# Patient Record
Sex: Female | Born: 1965 | Race: Black or African American | Hispanic: No | Smoking: Never smoker
Health system: Southern US, Community
[De-identification: ages and names within clinical notes are randomized; demographics above are authoritative.]

## PROBLEM LIST (undated history)

## (undated) DIAGNOSIS — I493 Ventricular premature depolarization: Secondary | ICD-10-CM

## (undated) DIAGNOSIS — I340 Nonrheumatic mitral (valve) insufficiency: Secondary | ICD-10-CM

## (undated) DIAGNOSIS — Z9289 Personal history of other medical treatment: Secondary | ICD-10-CM

## (undated) DIAGNOSIS — I10 Essential (primary) hypertension: Secondary | ICD-10-CM

## (undated) HISTORY — DX: Personal history of other medical treatment: Z92.89

## (undated) HISTORY — DX: Ventricular premature depolarization: I49.3

## (undated) HISTORY — PX: WISDOM TOOTH EXTRACTION: SHX21

## (undated) HISTORY — DX: Nonrheumatic mitral (valve) insufficiency: I34.0

## (undated) HISTORY — DX: Essential (primary) hypertension: I10

---

## 2001-06-19 ENCOUNTER — Emergency Department (HOSPITAL_COMMUNITY): Admission: EM | Admit: 2001-06-19 | Discharge: 2001-06-19 | Payer: Self-pay

## 2003-09-29 HISTORY — PX: HERNIA REPAIR: SHX51

## 2008-06-19 ENCOUNTER — Ambulatory Visit: Payer: Self-pay | Admitting: Obstetrics and Gynecology

## 2009-11-13 ENCOUNTER — Ambulatory Visit: Payer: Self-pay | Admitting: Obstetrics and Gynecology

## 2010-02-26 ENCOUNTER — Emergency Department (HOSPITAL_COMMUNITY): Admission: EM | Admit: 2010-02-26 | Discharge: 2010-02-26 | Payer: Self-pay | Admitting: Emergency Medicine

## 2010-07-04 ENCOUNTER — Ambulatory Visit: Payer: Self-pay | Admitting: Obstetrics and Gynecology

## 2010-10-16 ENCOUNTER — Other Ambulatory Visit (HOSPITAL_COMMUNITY): Payer: Self-pay | Admitting: Obstetrics and Gynecology

## 2010-10-16 DIAGNOSIS — Z1231 Encounter for screening mammogram for malignant neoplasm of breast: Secondary | ICD-10-CM

## 2010-11-14 ENCOUNTER — Ambulatory Visit (HOSPITAL_COMMUNITY): Payer: Self-pay | Attending: Obstetrics and Gynecology

## 2011-03-26 ENCOUNTER — Inpatient Hospital Stay (INDEPENDENT_AMBULATORY_CARE_PROVIDER_SITE_OTHER)
Admission: RE | Admit: 2011-03-26 | Discharge: 2011-03-26 | Disposition: A | Discharge status: Home / Self Care | Payer: 59 | Source: Ambulatory Visit | Attending: Family Medicine | Admitting: Family Medicine

## 2011-03-26 DIAGNOSIS — W57XXXA Bitten or stung by nonvenomous insect and other nonvenomous arthropods, initial encounter: Secondary | ICD-10-CM

## 2011-03-26 DIAGNOSIS — T148 Other injury of unspecified body region: Secondary | ICD-10-CM

## 2011-05-14 ENCOUNTER — Encounter: Payer: Self-pay | Admitting: Sports Medicine

## 2011-05-14 ENCOUNTER — Ambulatory Visit (INDEPENDENT_AMBULATORY_CARE_PROVIDER_SITE_OTHER): Payer: 59 | Admitting: Sports Medicine

## 2011-05-14 VITALS — BP 125/84 | HR 70 | Ht 64.0 in | Wt 155.0 lb

## 2011-05-14 DIAGNOSIS — M25579 Pain in unspecified ankle and joints of unspecified foot: Secondary | ICD-10-CM

## 2011-05-14 DIAGNOSIS — M2140 Flat foot [pes planus] (acquired), unspecified foot: Secondary | ICD-10-CM

## 2011-05-14 DIAGNOSIS — M25572 Pain in left ankle and joints of left foot: Secondary | ICD-10-CM | POA: Insufficient documentation

## 2011-05-14 DIAGNOSIS — M79673 Pain in unspecified foot: Secondary | ICD-10-CM

## 2011-05-14 DIAGNOSIS — M79609 Pain in unspecified limb: Secondary | ICD-10-CM

## 2011-05-14 DIAGNOSIS — M2142 Flat foot [pes planus] (acquired), left foot: Secondary | ICD-10-CM

## 2011-05-14 DIAGNOSIS — M2141 Flat foot [pes planus] (acquired), right foot: Secondary | ICD-10-CM | POA: Insufficient documentation

## 2011-05-14 DIAGNOSIS — Q6650 Congenital pes planus, unspecified foot: Secondary | ICD-10-CM

## 2011-05-14 DIAGNOSIS — Q666 Other congenital valgus deformities of feet: Secondary | ICD-10-CM | POA: Insufficient documentation

## 2011-05-14 NOTE — Progress Notes (Signed)
  Subjective:    Patient ID: Jodi Hanson, female    DOB: 11-12-1965, 45 y.o.   MRN: [id number]  HPI Jodi Hanson is a new patient complaining of B/L arch pain after running. She ran 3 years ago, stop running completely and restarted 1 month ago.  R arch worse than L during running, 7/10, improve by rest and tapping, not radiated, no injuries, she had this pain 3 years ago. She is in running school but she is also running on her own. Also L ankle pain with 2/10, dull ache, no numbness, no tingling , no injuries. She is using a ankle brace which helps. Her pain improves after running and resting. She has orthotics for the last year.   PMH: HTN  Meds: Enalapril.   No Known Allergies  History   Social History  . Marital Status: Married    Spouse Name: N/A    Number of Children: N/A  . Years of Education: N/A   Social History Main Topics  . Smoking status: Never Smoker   . Smokeless tobacco: Never Used  . Alcohol Use: None  . Drug Use: None  . Sexually Active: None   Other Topics Concern  . None   Social History Narrative  . None      Review of Systems  Constitutional: Negative for fever, chills, diaphoresis and fatigue.  Musculoskeletal: Negative for back pain, joint swelling and gait problem.       Feet pain with HPI  Neurological: Negative for weakness and numbness.          Objective:   Physical Exam  Constitutional: She appears well-developed and well-nourished.       BP 125/84  Pulse 70  Ht 5\' 4"  (1.626 m)  Wt 155 lb (70.308 kg)  BMI 26.61 kg/m2   Pulmonary/Chest: Effort normal.  Neurological: She is alert.       Flat feet with fore and mid feet collapse. There is not TTP in the archs or heels b/l. Sensation intact distally.  L ankle with intact skin, FROM , mild TTP at the ATFL.  Drawer test 1+. Tilt test negative.  Gait with B/l collapse arch.  Hypermobility for back flexion,genu recurvatum, left elbow mild recurvatum 5 degrees.  Skin: Skin  is warm. No rash noted. No erythema. No pallor.  Psychiatric: She has a normal mood and affect. Judgment normal.          Assessment & Plan:   1. Foot pain   2. Pes planus (flat feet)   3. Pes valgus   4. Ankle pain, left   Sports insoles Arch straps Mid scaphoid pads F/u in 4 weeks. We will make custom orthotics if she notice a benefits with the corrections made today.

## 2011-06-18 ENCOUNTER — Ambulatory Visit (INDEPENDENT_AMBULATORY_CARE_PROVIDER_SITE_OTHER): Payer: 59 | Admitting: Sports Medicine

## 2011-06-18 VITALS — BP 108/70

## 2011-06-18 DIAGNOSIS — M2141 Flat foot [pes planus] (acquired), right foot: Secondary | ICD-10-CM

## 2011-06-18 DIAGNOSIS — M25579 Pain in unspecified ankle and joints of unspecified foot: Secondary | ICD-10-CM

## 2011-06-18 DIAGNOSIS — M79673 Pain in unspecified foot: Secondary | ICD-10-CM

## 2011-06-18 DIAGNOSIS — M2140 Flat foot [pes planus] (acquired), unspecified foot: Secondary | ICD-10-CM

## 2011-06-18 DIAGNOSIS — M79609 Pain in unspecified limb: Secondary | ICD-10-CM

## 2011-06-18 DIAGNOSIS — M25572 Pain in left ankle and joints of left foot: Secondary | ICD-10-CM

## 2011-06-18 NOTE — Progress Notes (Signed)
  Subjective:    Patient ID: Jodi Hanson, female    DOB: 09-Aug-1966, 45 y.o.   MRN: [id number]  HPI  Coming to f/u on feet arch pain and left ankle pain. Her arch pain is gone after using the sport insoles with scaphoid pads and the arch strap. She is running 10-12 mi a week with no feet pain. She has been using her orthotics on her shoes to run. Her left ankle hurts after she has run for 45 min, it does not hurts while she runs but after, she denies any ankle pain during regular daily life activities. Her pain is 3/10, ache, no radiated, worse with running, better with rest.  She states that she rolled her ankle in the past , never did formal PT. Patient Active Problem List  Diagnoses  . Foot pain  . Pes planus (flat feet)  . Pes valgus  . Ankle pain, left   Current Outpatient Prescriptions on File Prior to Visit  Medication Sig Dispense Refill  . calcium carbonate 200 MG capsule Take 250 mg by mouth daily.        . enalapril (VASOTEC) 5 MG tablet Take 5 mg by mouth daily.        Marland Kitchen estradiol (ESTRACE) 2 MG tablet Take 2 mg by mouth daily.        . Multiple Vitamins-Minerals (MULTIVITAMIN WITH MINERALS) tablet Take 1 tablet by mouth daily.        . progesterone (PROMETRIUM) 100 MG capsule Take 100 mg by mouth daily.         No Known Allergies     Review of Systems  Constitutional: Negative for fever, chills and fatigue.  HENT: Negative for neck pain.   Musculoskeletal: Negative for joint swelling and gait problem.       Feet arch pain resolved. Left ankle pain with HPI  Neurological: Negative for seizures, weakness and numbness.       Objective:   Physical Exam  Constitutional: She is oriented to person, place, and time. She appears well-developed and well-nourished.       BP 108/70   Pulmonary/Chest: Effort normal. Tenderness: .vs.  Musculoskeletal:       Flat Feet. No TTP in the arch B/L. No heel pain.  L ankle with intact skin, no swelling , no inflammation. TTP  in ATFL.    Anterior drawer test negative. Negative tilt test.  Gait independent w/o limp.  Sensation intact distally.     Neurological: She is alert and oriented to person, place, and time.  Skin: Skin is warm. No rash noted. No erythema. No pallor.  Psychiatric: She has a normal mood and affect.          Assessment & Plan:   1. Foot pain   2. Pes planus (flat feet)   3. Ankle pain, left    She will schedule an appointment to have custom orthotics done. We started her on and ankle ASO brace for her l;eft ankle pain. We will let her try running with the brace to see if that improves her pain. If her ankle pain persist , we recommend doing image studies MSK U/S or x ray to r/o OCD lesion or any other structural abnormalities.

## 2011-06-25 ENCOUNTER — Encounter: Payer: 59 | Admitting: Family Medicine

## 2012-04-12 ENCOUNTER — Encounter: Payer: 59 | Attending: Internal Medicine | Admitting: *Deleted

## 2012-04-12 ENCOUNTER — Encounter: Payer: Self-pay | Admitting: *Deleted

## 2012-04-12 DIAGNOSIS — E669 Obesity, unspecified: Secondary | ICD-10-CM | POA: Insufficient documentation

## 2012-04-12 DIAGNOSIS — Z713 Dietary counseling and surveillance: Secondary | ICD-10-CM | POA: Insufficient documentation

## 2012-04-12 NOTE — Patient Instructions (Signed)
Contact [employer] about joining- continue to swim 2 days/week.  Add jogging 2 days/week, continue to walk daily  Eventually add weight training 2 days/week Prepare foods fresh as much as possible.  Limit processed foods and eating out Look for Fiber One chocolate cereal or brownies/chocolate cookie bars- great afternoon sweet snack Choose high protein- high fiber breakfast cereal like Kashi Aim for lean protein at every meal Aim for 2 fruits and 2 vegetables  day

## 2012-04-12 NOTE — Progress Notes (Signed)
  Medical Nutrition Therapy:  Appt start time: 0830 end time:  0930.   Assessment:  Primary concerns today: overweight and hypertension.  Feel tired, moody, sluggish and believe that her diet/lifestyle contributes to energy level.   MEDICATIONS: see list   DIETARY INTAKE:  Usual eating pattern includes 3 meals and 2-3 snacks per day.  Everyday foods include lean meats, fruits, vegetables, some starches.  Avoided foods include fast food.    24-hr recall:  B ( AM): cheerios, shredded wheat, special K with soy milk (may have 2 servings)  Snk ( AM): granola bar or cereal bar or fruit  L ( PM): deli wheat sandwich ([place]) subway sandwich with vegetables and oil/vinegar; may have fried chicken wings Snk ( PM): frozen yogurt, cookies, or brownies D ( PM): grilled chicken, grilled fish, green vegetables, light dressing on salad. No starch with dinner Snk ( PM): popsicle or margarita or wine Beverages: water  Usual physical activity: swim 2 days a week (45 min) and walk 20 min daily  Estimated energy needs: 1500 calories 170 g carbohydrates 112 g protein 42 g fat  Progress Towards Goal(s):  Some progress.   Nutritional Diagnosis:  [place]-3.3 Overweight/obesity As related to limited physical activity and history of contuming energy-dense foods.  As evidenced by BMI of 27.    Intervention:  Nutrition counseling provided.  Jodi Hanson was 167.5lb in December of 2012 and has lost 8 pounds since then!!  She has cut back on fast food consumption and has increased her physical activity some.  She would like to loose about 20 lb more.  She also has hypertension and tries to limit her sodium consumption.  Encouraged her to limit, not only added salt, but also instant and processed and ready-to-eat meals.  Discussed adding more fiber and lean proteins to her meals though fruits, vegetables, whole grains, nuts and seeds.  She was curious about a gluten-free diet,  Discouraged her from cutting out food groups  without a diagnosis of Celiac's.  Encouraged increasing physical activity and adding weight training to her routine  Handouts given during visit include:  Tips for weight loss  Monitoring/Evaluation:  Dietary intake, exercise, and body weight prn.  Jodi Hanson will call to make appointment

## 2014-06-28 ENCOUNTER — Ambulatory Visit (INDEPENDENT_AMBULATORY_CARE_PROVIDER_SITE_OTHER): Payer: BC Managed Care – PPO | Admitting: Family Medicine

## 2014-06-28 VITALS — BP 112/70 | HR 83 | Temp 98.5°F | Resp 16 | Ht 64.75 in | Wt 150.2 lb

## 2014-06-28 DIAGNOSIS — J069 Acute upper respiratory infection, unspecified: Secondary | ICD-10-CM

## 2014-06-28 DIAGNOSIS — I10 Essential (primary) hypertension: Secondary | ICD-10-CM

## 2014-06-28 LAB — POCT RAPID STREP A (OFFICE): Rapid Strep A Screen: NEGATIVE

## 2014-06-28 LAB — POCT INFLUENZA A/B
Influenza A, POC: NEGATIVE
Influenza B, POC: NEGATIVE

## 2014-06-28 MED ORDER — MUCINEX DM 30-600 MG PO TB12: 1.0000 | ORAL_TABLET | Freq: Two times a day (BID) | ORAL | Status: DC

## 2014-06-28 MED ORDER — BENZONATATE 100 MG PO CAPS: 100.0000 mg | ORAL_CAPSULE | Freq: Three times a day (TID) | ORAL | Status: DC | PRN

## 2014-06-28 MED ORDER — AMLODIPINE BESYLATE 5 MG PO TABS: 5.0000 mg | ORAL_TABLET | Freq: Every day | ORAL | Status: DC

## 2014-06-28 NOTE — Patient Instructions (Signed)
Upper Respiratory Infection, Adult An upper respiratory infection (URI) is also sometimes known as the common cold. The upper respiratory tract includes the nose, sinuses, throat, trachea, and bronchi. Bronchi are the airways leading to the lungs. Most people improve within 1 week, but symptoms can last up to 2 weeks. A residual cough may last even longer.  CAUSES Many different viruses can infect the tissues lining the upper respiratory tract. The tissues become irritated and inflamed and often become very moist. Mucus production is also common. A cold is contagious. You can easily spread the virus to others by oral contact. This includes kissing, sharing a glass, coughing, or sneezing. Touching your mouth or nose and then touching a surface, which is then touched by another person, can also spread the virus. SYMPTOMS  Symptoms typically develop 1 to 3 days after you come in contact with a cold virus. Symptoms vary from person to person. They may include:  Runny nose.  Sneezing.  Nasal congestion.  Sinus irritation.  Sore throat.  Loss of voice (laryngitis).  Cough.  Fatigue.  Muscle aches.  Loss of appetite.  Headache.  Low-grade fever. DIAGNOSIS  You might diagnose your own cold based on familiar symptoms, since most people get a cold 2 to 3 times a year. Your caregiver can confirm this based on your exam. Most importantly, your caregiver can check that your symptoms are not due to another disease such as strep throat, sinusitis, pneumonia, asthma, or epiglottitis. Blood tests, throat tests, and X-rays are not necessary to diagnose a common cold, but they may sometimes be helpful in excluding other more serious diseases. Your caregiver will decide if any further tests are required. RISKS AND COMPLICATIONS  You may be at risk for a more severe case of the common cold if you smoke cigarettes, have chronic heart disease (such as heart failure) or lung disease (such as asthma), or if  you have a weakened immune system. The very young and very old are also at risk for more serious infections. Bacterial sinusitis, middle ear infections, and bacterial pneumonia can complicate the common cold. The common cold can worsen asthma and chronic obstructive pulmonary disease (COPD). Sometimes, these complications can require emergency medical care and may be life-threatening. PREVENTION  The best way to protect against getting a cold is to practice good hygiene. Avoid oral or hand contact with people with cold symptoms. Wash your hands often if contact occurs. There is no clear evidence that vitamin C, vitamin E, echinacea, or exercise reduces the chance of developing a cold. However, it is always recommended to get plenty of rest and practice good nutrition. TREATMENT  Treatment is directed at relieving symptoms. There is no cure. Antibiotics are not effective, because the infection is caused by a virus, not by bacteria. Treatment may include:  Increased fluid intake. Sports drinks offer valuable electrolytes, sugars, and fluids.  Breathing heated mist or steam (vaporizer or shower).  Eating chicken soup or other clear broths, and maintaining good nutrition.  Getting plenty of rest.  Using gargles or lozenges for comfort.  Controlling fevers with ibuprofen or acetaminophen as directed by your caregiver.  Increasing usage of your inhaler if you have asthma. Zinc gel and zinc lozenges, taken in the first 24 hours of the common cold, can shorten the duration and lessen the severity of symptoms. Pain medicines may help with fever, muscle aches, and throat pain. A variety of non-prescription medicines are available to treat congestion and runny nose. Your caregiver   can make recommendations and may suggest nasal or lung inhalers for other symptoms.  HOME CARE INSTRUCTIONS   Only take over-the-counter or prescription medicines for pain, discomfort, or fever as directed by your  caregiver.  Use a warm mist humidifier or inhale steam from a shower to increase air moisture. This may keep secretions moist and make it easier to breathe.  Drink enough water and fluids to keep your urine clear or pale yellow.  Rest as needed.  Return to work when your temperature has returned to normal or as your caregiver advises. You may need to stay home longer to avoid infecting others. You can also use a face mask and careful hand washing to prevent spread of the virus. SEEK MEDICAL CARE IF:   After the first few days, you feel you are getting worse rather than better.  You need your caregiver's advice about medicines to control symptoms.  You develop chills, worsening shortness of breath, or brown or red sputum. These may be signs of pneumonia.  You develop yellow or brown nasal discharge or pain in the face, especially when you bend forward. These may be signs of sinusitis.  You develop a fever, swollen neck glands, pain with swallowing, or white areas in the back of your throat. These may be signs of strep throat. SEEK IMMEDIATE MEDICAL CARE IF:   You have a fever.  You develop severe or persistent headache, ear pain, sinus pain, or chest pain.  You develop wheezing, a prolonged cough, cough up blood, or have a change in your usual mucus (if you have chronic lung disease).  You develop sore muscles or a stiff neck. Document Released: 03/10/2001 Document Revised: 12/07/2011 Document Reviewed: 12/20/2013 ExitCare Patient Information 2015 [employer]. This information is not intended to replace advice given to you by your health care provider. Make sure you discuss any questions you have with your health care provider.  

## 2014-06-28 NOTE — Progress Notes (Addendum)
Subjective:    Patient ID: Jodi Hanson, female    DOB: June 22, 1966, 48 y.o.   MRN: [id number] This chart was scribed for Reginia Forts, MD by Randa Evens, ED Scribe. This Patient was seen in room 10 and the patients care was started at 8:03 PM  06/28/2014  Sore Throat, Headache, Generalized Body Aches and Cough   Sore Throat  Associated symptoms include congestion, coughing and headaches. Pertinent negatives include no abdominal pain, diarrhea, ear pain, shortness of breath or vomiting.  Headache  Associated symptoms include coughing and a sore throat. Pertinent negatives include no abdominal pain, ear pain, fever, nausea, rhinorrhea, sinus pressure or vomiting.  Cough Associated symptoms include chills, headaches and a sore throat. Pertinent negatives include no ear pain, fever, rash, rhinorrhea or shortness of breath.   HPI Comments: Jodi Hanson is a 48 y.o. female who presents to the [facility] complaining of sore throat onset 2 days prior. She states she has associated headache, productive cough, and  head congestion. She states she has tried benadryl with temporary relief. She states she has also been drinking a lot of hot tea and vitamin C with temporary relief. She denies receiving a flu shot this year. Denies ear pain, rhinorrhea, SOB when walking, vomiting, or diarrhea.   Patient also requesting refill of blood pressure medication. Patient reports good compliance with medication, good tolerance to medication, and good symptom control.     Review of Systems  Constitutional: Positive for chills, diaphoresis and fatigue. Negative for fever.  HENT: Positive for congestion and sore throat. Negative for ear pain, rhinorrhea and sinus pressure.   Respiratory: Positive for cough. Negative for shortness of breath.   Gastrointestinal: Negative for nausea, vomiting, abdominal pain and diarrhea.  Skin: Negative for rash.  Neurological: Positive for headaches.     Past Medical History  Diagnosis Date   Hypertension    Past Surgical History  Procedure Laterality Date   Cesarean section     No Known Allergies Current Outpatient Prescriptions  Medication Sig Dispense Refill   amLODipine (NORVASC) 5 MG tablet Take 1 tablet (5 mg total) by mouth daily.  30 tablet  2   benzonatate (TESSALON) 100 MG capsule Take 1-2 capsules (100-200 mg total) by mouth 3 (three) times daily as needed for cough.  60 capsule  0   calcium carbonate 200 MG capsule Take 250 mg by mouth daily.         Dextromethorphan-Guaifenesin (MUCINEX DM) 30-600 MG TB12 Take 1 tablet by mouth 2 (two) times daily.  28 each  0   enalapril (VASOTEC) 5 MG tablet Take 5 mg by mouth daily.         estradiol (ESTRACE) 2 MG tablet Take 2 mg by mouth daily.         Multiple Vitamins-Minerals (MULTIVITAMIN WITH MINERALS) tablet Take 1 tablet by mouth daily.         progesterone (PROMETRIUM) 100 MG capsule Take 100 mg by mouth daily.         No current facility-administered medications for this visit.       Objective:    BP 112/70   Pulse 83   Temp(Src) 98.5 F (36.9 C) (Oral)   Resp 16   Ht 5' 4.75" (1.645 m)   Wt 150 lb 3.2 oz (68.13 kg)   BMI 25.18 kg/m2   SpO2 100%  Physical Exam  Nursing note and vitals reviewed. Constitutional: She is oriented to person,  place, and time. She appears well-developed and well-nourished. No distress.  HENT:  Head: Normocephalic and atraumatic.  Right Ear: External ear normal.  Left Ear: External ear normal.  Nose: Nose normal.  Mouth/Throat: Posterior oropharyngeal erythema present. No oropharyngeal exudate.  Eyes: Conjunctivae and EOM are normal. Pupils are equal, round, and reactive to light.  Neck: Normal range of motion. Neck supple. No tracheal deviation present.  Cardiovascular: Normal rate, regular rhythm and normal heart sounds.  Exam reveals no gallop and no friction rub.   No murmur heard. Pulmonary/Chest: Effort normal and  breath sounds normal. No respiratory distress. She has no wheezes. She has no rales.  Musculoskeletal: Normal range of motion.  Lymphadenopathy:    She has no cervical adenopathy.  Neurological: She is alert and oriented to person, place, and time.  Skin: Skin is warm and dry. No rash noted. She is not diaphoretic.  Psychiatric: She has a normal mood and affect. Her behavior is normal.    Results for orders placed in visit on 06/28/14  CULTURE, GROUP A STREP      Result Value Ref Range   Organism ID, Bacteria Normal Upper Respiratory Flora     Organism ID, Bacteria No Beta Hemolytic Streptococci Isolated    POCT RAPID STREP A (OFFICE)      Result Value Ref Range   Rapid Strep A Screen Negative  Negative  POCT INFLUENZA A/B      Result Value Ref Range   Influenza A, POC Negative     Influenza B, POC Negative         Assessment & Plan:   1. Acute upper respiratory infection   2. Essential hypertension     1. URI:  New.  Consistent with viral etiology.    Rx for [employer], Mucinex DM provided. Recommend rest, fluids, Ibuprofen and/or Tylenol.  RTC for acute worsening.  2.  HTN: controlled; refill of Amlodipine provided.  Recommend follow-up with PCP.    Meds ordered this encounter  Medications   DISCONTD: amLODipine (NORVASC) 5 MG tablet    Sig: Take 5 mg by mouth daily.   benzonatate (TESSALON) 100 MG capsule    Sig: Take 1-2 capsules (100-200 mg total) by mouth 3 (three) times daily as needed for cough.    Dispense:  60 capsule    Refill:  0   Dextromethorphan-Guaifenesin (MUCINEX DM) 30-600 MG TB12    Sig: Take 1 tablet by mouth 2 (two) times daily.    Dispense:  28 each    Refill:  0   amLODipine (NORVASC) 5 MG tablet    Sig: Take 1 tablet (5 mg total) by mouth daily.    Dispense:  30 tablet    Refill:  2    No Follow-up on file.   I personally performed the services described in this documentation, which was scribed in my presence.  The recorded  information has been reviewed and is accurate.  Reginia Forts, M.D.  [facility] [address] [place], [place]  [address] [phone] phone [phone] fax

## 2014-07-01 LAB — CULTURE, GROUP A STREP: Organism ID, Bacteria: NORMAL

## 2014-09-18 ENCOUNTER — Telehealth: Payer: Self-pay

## 2014-09-18 NOTE — Telephone Encounter (Signed)
Advised pt to call PCP. She states she will. She no longer has insurance coverage. Advised her to call us back if not able to get the change please contact us back.

## 2014-09-18 NOTE — Telephone Encounter (Signed)
Pt would like to know if we are able to change her bp meds due to insurance, her insurance won't cover what she was given but if we changed to something else and can get at [facility] FOR 10.00 dollars She think it may be ENALAPRIL 5MG S, please call pt at [place]

## 2014-09-19 ENCOUNTER — Other Ambulatory Visit: Payer: Self-pay

## 2014-09-19 DIAGNOSIS — I10 Essential (primary) hypertension: Secondary | ICD-10-CM

## 2014-09-19 MED ORDER — AMLODIPINE BESYLATE 5 MG PO TABS
5.0000 mg | ORAL_TABLET | Freq: Every day | ORAL | Status: DC
Start: 2014-09-19 — End: 2015-01-18

## 2014-12-12 ENCOUNTER — Encounter: Payer: Self-pay | Admitting: Family

## 2014-12-12 ENCOUNTER — Ambulatory Visit (INDEPENDENT_AMBULATORY_CARE_PROVIDER_SITE_OTHER): Payer: 59 | Admitting: Family

## 2014-12-12 VITALS — BP 102/78 | HR 77 | Temp 98.0°F | Resp 16 | Ht 63.5 in | Wt 147.4 lb

## 2014-12-12 DIAGNOSIS — Z0000 Encounter for general adult medical examination without abnormal findings: Secondary | ICD-10-CM

## 2014-12-12 DIAGNOSIS — I10 Essential (primary) hypertension: Secondary | ICD-10-CM

## 2014-12-12 NOTE — Patient Instructions (Signed)
Decrease amlodipine to 2.5 mg once daily (1/2 tab). Follow up in 1 month for a complete physical.  Welcome to [facility]!

## 2014-12-12 NOTE — Progress Notes (Signed)
Subjective:    Patient ID: Jodi Hanson, female    DOB: 04/18/1966, 49 y.o.   MRN: [id number]  HPI  Jodi Hanson is a 49 yr old female who presents today to establish care. Took meds starting at 48.    Patient is currently maintained on the following medications for blood pressure: amlodipine 5mg  once daily.  Patient reports good compliance with blood pressure medications. Patient denies chest pain, shortness of breath or swelling. Last 3 blood pressure readings in our office are as follows: BP Readings from Last 3 Encounters:  12/12/14 102/78  06/28/14 112/70  06/18/11 108/70   She reports that she has had hx of hyperlipidemia- mild.    Review of Systems  Constitutional: Negative for unexpected weight change.  HENT: Negative for hearing loss and rhinorrhea.   Eyes: Negative for visual disturbance.  Respiratory: Negative for cough and shortness of breath.   Cardiovascular: Negative for chest pain and leg swelling.  Gastrointestinal: Negative for nausea, diarrhea and constipation.  Genitourinary: Negative for dysuria, frequency and hematuria.  Musculoskeletal: Negative for myalgias and arthralgias.  Skin: Negative for rash.  Neurological: Negative for headaches.  Hematological: Negative for adenopathy.  Psychiatric/Behavioral: Negative for dysphoric mood and agitation.       Past Medical History  Diagnosis Date  . Hypertension     History   Social History  . Marital Status: Divorced    Spouse Name: N/A  . Number of Children: N/A  . Years of Education: N/A   Occupational History  . Not on file.   Social History Main Topics  . Smoking status: Never Smoker   . Smokeless tobacco: Never Used  . Alcohol Use: 1.8 oz/week    3 Glasses of wine per week  . Drug Use: Not on file  . Sexual Activity: Not on file   Other Topics Concern  . 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 Cesarean section      Family History  Problem Relation Age of Onset  . Hypertension Other   . Diabetes Mother   . Hyperlipidemia Mother   . Hypertension Mother   . Heart disease Father   . Hypertension Brother   . Hypertension Brother   . HIV Brother     No Known Allergies  Current Outpatient Prescriptions on File Prior to Visit  Medication Sig Dispense Refill  . amLODipine (NORVASC) 5 MG tablet Take 1 tablet (5 mg total) by mouth daily. 90 tablet 0  . Multiple Vitamins-Minerals (MULTIVITAMIN WITH MINERALS) tablet Take 1 tablet by mouth daily.       No current facility-administered medications on file prior to visit.    BP 102/78 mmHg  Pulse 77  Temp(Src) 98 F (36.7 C) (Oral)  Resp 16  Ht 5' 3.5" (1.613 m)  Wt 147 lb 6.4 oz (66.86 kg)  BMI 25.70 kg/m2  SpO2 99%    Objective:   Physical Exam  Constitutional: She is oriented to person, place, and time. She appears well-developed and well-nourished.  HENT:  Head: Normocephalic and atraumatic.  Right Ear: Tympanic membrane and ear canal normal.  Left Ear: Tympanic membrane and ear canal normal.  Mouth/Throat: No oropharyngeal exudate, posterior oropharyngeal edema or posterior oropharyngeal erythema.  Cardiovascular:  Normal rate, regular rhythm and normal heart sounds.   No murmur heard. Pulmonary/Chest: Effort normal and breath sounds normal. No respiratory distress. She has no wheezes.  Abdominal: Soft. She exhibits no distension. There is no tenderness. There is no rebound.  Neurological: She is alert and oriented to person, place, and time.  Psychiatric: She has a normal mood and affect. Her behavior is normal. Judgment and thought content normal.          Assessment & Plan:  She is requesting referral to optometry for routine eye exam.

## 2014-12-12 NOTE — Assessment & Plan Note (Signed)
Appears overtreated.  Decrease amlodipine from 5mg  to 2.5mg . Consider d/c next visit pending BP review.  Plan to check routine lab work at her upcoming physical in 1 month.

## 2014-12-28 ENCOUNTER — Telehealth: Payer: Self-pay | Admitting: Family

## 2014-12-28 NOTE — Telephone Encounter (Signed)
Pre visit letter sent  °

## 2015-01-16 ENCOUNTER — Telehealth: Payer: Self-pay | Admitting: *Deleted

## 2015-01-16 NOTE — Telephone Encounter (Signed)
Unable to reach patient at time of Pre-Visit Call.  Left message for patient to return call when available.    

## 2015-01-16 NOTE — Telephone Encounter (Signed)
Pre visit completed 

## 2015-01-18 ENCOUNTER — Ambulatory Visit (INDEPENDENT_AMBULATORY_CARE_PROVIDER_SITE_OTHER): Payer: 59 | Admitting: Family

## 2015-01-18 ENCOUNTER — Ambulatory Visit (HOSPITAL_BASED_OUTPATIENT_CLINIC_OR_DEPARTMENT_OTHER)
Admission: RE | Admit: 2015-01-18 | Discharge: 2015-01-18 | Disposition: A | Discharge status: Home / Self Care | Payer: 59 | Source: Ambulatory Visit | Attending: Family | Admitting: Family

## 2015-01-18 VITALS — BP 118/78 | HR 73 | Temp 98.0°F | Resp 16 | Ht 63.5 in | Wt 140.0 lb

## 2015-01-18 DIAGNOSIS — Z0000 Encounter for general adult medical examination without abnormal findings: Secondary | ICD-10-CM | POA: Diagnosis not present

## 2015-01-18 DIAGNOSIS — Z1231 Encounter for screening mammogram for malignant neoplasm of breast: Secondary | ICD-10-CM | POA: Diagnosis not present

## 2015-01-18 DIAGNOSIS — I10 Essential (primary) hypertension: Secondary | ICD-10-CM

## 2015-01-18 LAB — CBC WITH DIFFERENTIAL/PLATELET
Basophils Absolute: 0 10*3/uL (ref 0.0–0.1)
Basophils Relative: 0.9 % (ref 0.0–3.0)
Eosinophils Absolute: 0.1 10*3/uL (ref 0.0–0.7)
Eosinophils Relative: 2.7 % (ref 0.0–5.0)
HCT: 38.5 % (ref 36.0–46.0)
Hemoglobin: 12.9 g/dL (ref 12.0–15.0)
Lymphocytes Relative: 46.7 % — ABNORMAL HIGH (ref 12.0–46.0)
Lymphs Abs: 1.8 10*3/uL (ref 0.7–4.0)
MCHC: 33.6 g/dL (ref 30.0–36.0)
MCV: 78.7 fl (ref 78.0–100.0)
Monocytes Absolute: 0.4 10*3/uL (ref 0.1–1.0)
Monocytes Relative: 8.9 % (ref 3.0–12.0)
Neutro Abs: 1.6 10*3/uL (ref 1.4–7.7)
Neutrophils Relative %: 40.8 % — ABNORMAL LOW (ref 43.0–77.0)
Platelets: 323 10*3/uL (ref 150.0–400.0)
RBC: 4.9 Mil/uL (ref 3.87–5.11)
RDW: 14.1 % (ref 11.5–15.5)
WBC: 4 10*3/uL (ref 4.0–10.5)

## 2015-01-18 LAB — URINALYSIS, ROUTINE W REFLEX MICROSCOPIC
Bilirubin Urine: NEGATIVE
Hgb urine dipstick: NEGATIVE
Ketones, ur: NEGATIVE
Leukocytes, UA: NEGATIVE
Nitrite: NEGATIVE
Specific Gravity, Urine: >=1.03 — AB (ref 1.000–1.030)
Total Protein, Urine: NEGATIVE
Urine Glucose: NEGATIVE
Urobilinogen, UA: 0.2 (ref 0.0–1.0)
pH: 5.5 (ref 5.0–8.0)

## 2015-01-18 LAB — BASIC METABOLIC PANEL
BUN: 10 mg/dL (ref 6–23)
CO2: 30 mEq/L (ref 19–32)
Calcium: 9.2 mg/dL (ref 8.4–10.5)
Chloride: 105 mEq/L (ref 96–112)
Creatinine, Ser: 0.84 mg/dL (ref 0.40–1.20)
GFR: 93.72 mL/min (ref >60.00)
Glucose, Bld: 86 mg/dL (ref 70–99)
Potassium: 4 mEq/L (ref 3.5–5.1)
Sodium: 140 mEq/L (ref 135–145)

## 2015-01-18 LAB — TSH: TSH: 1.24 u[IU]/mL (ref 0.35–4.50)

## 2015-01-18 LAB — HEPATIC FUNCTION PANEL
ALT: 24 U/L (ref 0–35)
AST: 27 U/L (ref 0–37)
Albumin: 4.2 g/dL (ref 3.5–5.2)
Alkaline Phosphatase: 88 U/L (ref 39–117)
Bilirubin, Direct: 0.2 mg/dL (ref 0.0–0.3)
Total Bilirubin: 0.8 mg/dL (ref 0.2–1.2)
Total Protein: 7.4 g/dL (ref 6.0–8.3)

## 2015-01-18 LAB — LIPID PANEL
Cholesterol: 193 mg/dL (ref 0–200)
HDL: 58.3 mg/dL (ref >39.00)
LDL Cholesterol: 126 mg/dL — ABNORMAL HIGH (ref 0–99)
NonHDL: 134.7
Total CHOL/HDL Ratio: 3
Triglycerides: 46 mg/dL (ref 0.0–149.0)
VLDL: 9.2 mg/dL (ref 0.0–40.0)

## 2015-01-18 LAB — HIV ANTIBODY (ROUTINE TESTING W REFLEX): HIV 1&2 Ab, 4th Generation: NONREACTIVE

## 2015-01-18 NOTE — Progress Notes (Signed)
Pre visit review using our clinic review tool, if applicable. No additional management support is needed unless otherwise documented below in the visit note. 

## 2015-01-18 NOTE — Patient Instructions (Signed)
Please complete lab work prior to leaving. Schedule mammogram on the first floor. Stop amlodipine. Check blood pressure once daily for 1 week. Send me your blood pressure readings via mychart. Follow up in 3 months.

## 2015-01-18 NOTE — Progress Notes (Signed)
Subjective:    Patient ID: Jodi Hanson, female    DOB: 10/09/1965, 49 y.o.   MRN: [id number]  HPI   Patient presents today for complete physical.  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 Patient denies chest pain, shortness of breath or swelling. Last 3 blood pressure readings in our office are as follows:  BP Readings from Last 3 Encounters:  01/18/15 118/78  12/12/14 102/78  06/28/14 112/70         Review of Systems  Constitutional: Negative for unexpected weight change.  HENT: Negative for rhinorrhea.   Respiratory: Negative for cough and shortness of breath.   Cardiovascular: Negative for chest pain.  Gastrointestinal: Negative for nausea, vomiting and diarrhea.  Genitourinary: Negative for dysuria and frequency.  Musculoskeletal: Negative for myalgias and arthralgias.  Skin: Negative for rash.  Neurological: Negative for headaches.  Hematological: Negative for adenopathy.  Psychiatric/Behavioral:       Depression/anxiety- denies   Past Medical History  Diagnosis Date  . Hypertension     History   Social History  . Marital Status: Divorced    Spouse Name: N/A  . Number of Children: N/A  . Years of Education: N/A   Occupational History  . Not on file.   Social History Main Topics  . Smoking status: Never Smoker   . Smokeless tobacco: Never Used  . Alcohol Use: 1.8 oz/week    3 Glasses of wine per week  . Drug Use: Not on file  . Sexual Activity: Not on file   Other Topics Concern  . 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 Cesarean section      Family History  Problem Relation Age of Onset  . Hypertension Other   . Diabetes Mother   . Hyperlipidemia Mother   . Hypertension Mother   . Heart disease Father   . Hypertension Brother   . Hypertension Brother   . HIV Brother     No Known Allergies  Current Outpatient Prescriptions on File Prior to Visit  Medication Sig Dispense Refill  . amLODipine (NORVASC) 5 MG tablet Take 1 tablet (5 mg total) by mouth daily. (Patient taking differently: Take 2.5 mg by mouth daily. ) 90 tablet 0  . Multiple Vitamins-Minerals (MULTIVITAMIN WITH MINERALS) tablet Take 1 tablet by mouth daily.       No current facility-administered medications on file prior to visit.    BP 118/78 mmHg  Pulse 73  Temp(Src) 98 F (36.7 C) (Oral)  Resp 16  Ht 5' 3.5" (1.613 m)  Wt 140 lb (63.504 kg)  BMI 24.41 kg/m2  SpO2 99%        Objective:   Physical Exam  Physical Exam  Constitutional: She is oriented to person, place, and time. She appears well-developed and well-nourished. No distress.  HENT:  Head: Normocephalic and atraumatic.  Right Ear: Tympanic membrane and ear canal normal.  Left Ear:  Tympanic membrane and ear canal normal.  Mouth/Throat: Oropharynx is clear and moist.  Eyes: Pupils are equal, round, and reactive to light. No scleral icterus.  Neck: Normal range of motion. No thyromegaly present.  Cardiovascular: Normal rate and regular rhythm.   No murmur heard. Pulmonary/Chest: Effort normal and breath sounds normal. No respiratory distress. He has no wheezes. She has no rales. She exhibits no tenderness.  Abdominal: Soft. Bowel sounds are normal. He exhibits no distension and no mass. There is no tenderness. There is no rebound and no guarding.  Musculoskeletal: She exhibits no edema.  Lymphadenopathy:    She has no cervical adenopathy.    Neurological: She is alert and oriented to person, place, and time. She has normal reflexes. She exhibits normal muscle tone. Coordination normal.  Skin: Skin is warm and dry.  Psychiatric: She has a normal mood and affect. Her behavior is normal. Judgment and thought content normal.  Breasts: Examined lying Right: Without masses, retractions, discharge or axillary adenopathy.  Left: Without masses, retractions, discharge or axillary adenopathy.           Assessment & Plan:         Assessment & Plan:

## 2015-01-18 NOTE — Assessment & Plan Note (Signed)
Pt reports 15 pound weight loss with diet/exercise.  BP looks good on only 2.5mg  of amlodipine.  Advised pt ok to for trial off of amlodipine.  She is instructed to check her BP at home and then email me reading.s

## 2015-01-18 NOTE — Assessment & Plan Note (Signed)
Continue healthy diet, exercise, obtain EKG, routine lab work. Ordered mammogram.

## 2015-01-20 ENCOUNTER — Encounter: Payer: Self-pay | Admitting: Family

## 2015-04-19 ENCOUNTER — Ambulatory Visit: Payer: 59 | Admitting: Family

## 2015-05-03 ENCOUNTER — Encounter: Payer: Self-pay | Admitting: Family

## 2015-05-03 ENCOUNTER — Ambulatory Visit (INDEPENDENT_AMBULATORY_CARE_PROVIDER_SITE_OTHER): Payer: 59 | Admitting: Family

## 2015-05-03 VITALS — BP 120/78 | HR 48 | Temp 97.5°F | Resp 16 | Ht 63.5 in | Wt 147.2 lb

## 2015-05-03 DIAGNOSIS — L74519 Primary focal hyperhidrosis, unspecified: Secondary | ICD-10-CM | POA: Diagnosis not present

## 2015-05-03 DIAGNOSIS — I10 Essential (primary) hypertension: Secondary | ICD-10-CM

## 2015-05-03 DIAGNOSIS — R61 Generalized hyperhidrosis: Secondary | ICD-10-CM | POA: Insufficient documentation

## 2015-05-03 MED ORDER — ALUMINUM CHLORIDE 20 % EX SOLN: Freq: Every day | CUTANEOUS | Status: DC

## 2015-05-03 NOTE — Progress Notes (Signed)
Pre visit review using our clinic review tool, if applicable. No additional management support is needed unless otherwise documented below in the visit note. 

## 2015-05-03 NOTE — Assessment & Plan Note (Signed)
Trial of Drysol QHS.

## 2015-05-03 NOTE — Patient Instructions (Signed)
Use drysol at bedtime.  Continue off of blood pressure meds. Please continue to monitor your BP at home and let me know if you blood pressure readings are consistently >140/90.

## 2015-05-03 NOTE — Progress Notes (Signed)
   Subjective:    Patient ID: Jodi Hanson, female    DOB: [birth year], 49 y.o.   MRN: [id number]  HPI  Jodi Hanson is a 49 yr old female who presents today for follow up.  HTN- BP appeared over treated last visit.  Amlodipine was decreased form 5mg  to 2.5mg .  Pt reports that she stopped amlodipine completely.  Reports BP's have been stable off of amlodipine.  BP Readings from Last 3 Encounters:  05/03/15 120/78  01/18/15 118/78  12/12/14 102/78   Perspiration- Reports that her deodorant wears off.  Reports she will wake up in the AM and notice body odor when "all I did was sleep."  Notes body odor in the afternoon despite use of deodorant.  Review of Systems     Past Medical History  Diagnosis Date  . Hypertension     History   Social History  . Marital Status: Divorced    Spouse Name: N/A  . Number of Children: N/A  . Years of Education: N/A   Occupational History  . Not on file.   Social History Main Topics  . Smoking status: Never Smoker   . Smokeless tobacco: Never Used  . Alcohol Use: 1.8 oz/week    3 Glasses of wine per week  . Drug Use: Not on file  . Sexual Activity: Not on file   Other Topics Concern  . 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 Cesarean section      Family History  Problem Relation Age of Onset  . Hypertension Other   . Diabetes Mother   . Hyperlipidemia Mother   . Hypertension Mother   . Heart disease Father   . Hypertension Brother   . Hypertension Brother   . HIV Brother     No Known Allergies  Current Outpatient Prescriptions on File Prior to Visit  Medication Sig Dispense Refill  . Multiple Vitamins-Minerals (MULTIVITAMIN WITH MINERALS) tablet Take 1 tablet by mouth daily.       No current facility-administered  medications on file prior to visit.    BP 120/78 mmHg  Pulse 48  Temp(Src) 97.5 F (36.4 C) (Oral)  Resp 16  Ht 5' 3.5" (1.613 m)  Wt 147 lb 3.2 oz (66.769 kg)  BMI 25.66 kg/m2  SpO2 99%    Objective:   Physical Exam  Constitutional: She is oriented to person, place, and time. She appears well-developed and well-nourished.  HENT:  Head: Normocephalic and atraumatic.  Cardiovascular: Normal rate, regular rhythm and normal heart sounds.   No murmur heard. Pulmonary/Chest: Effort normal and breath sounds normal. No respiratory distress. She has no wheezes.  Musculoskeletal: She exhibits no edema.  Neurological: She is alert and oriented to person, place, and time.  Skin: Skin is warm and dry.  Psychiatric: She has a normal mood and affect. Her behavior is normal. Judgment and thought content normal.          Assessment & Plan:

## 2015-05-03 NOTE — Assessment & Plan Note (Addendum)
Advised pt:  OK to continue off of blood pressure meds. Please continue to monitor your BP at home and let me know if you blood pressure readings are consistently >140/90.

## 2015-11-08 ENCOUNTER — Ambulatory Visit (INDEPENDENT_AMBULATORY_CARE_PROVIDER_SITE_OTHER): Payer: Managed Care, Other (non HMO) | Admitting: Family

## 2015-11-08 ENCOUNTER — Encounter: Payer: Self-pay | Admitting: Family

## 2015-11-08 VITALS — BP 139/88 | HR 72 | Temp 98.4°F | Resp 16 | Ht 63.5 in | Wt 146.4 lb

## 2015-11-08 DIAGNOSIS — I10 Essential (primary) hypertension: Secondary | ICD-10-CM | POA: Diagnosis not present

## 2015-11-08 DIAGNOSIS — L74519 Primary focal hyperhidrosis, unspecified: Secondary | ICD-10-CM | POA: Diagnosis not present

## 2015-11-08 DIAGNOSIS — R61 Generalized hyperhidrosis: Secondary | ICD-10-CM

## 2015-11-08 MED ORDER — ALUMINUM CHLORIDE 20 % EX SOLN
Freq: Every day | CUTANEOUS | Status: DC
Start: 2015-11-08 — End: 2016-09-24

## 2015-11-08 NOTE — Assessment & Plan Note (Signed)
Improved with drysol, continue same, refill sent.

## 2015-11-08 NOTE — Progress Notes (Signed)
   Subjective:    Patient ID: Jodi Hanson, female    DOB: 12-12-1965, 50 y.o.   MRN: [id number]  HPI   Jodi Hanson is a 50 yr old female who presents today for follow up.   HTN-  Training for a half marathon.  Watching her diet. Not currently on anti-hypertensives. BP Readings from Last 3 Encounters:  11/08/15 139/88  05/03/15 120/78  01/18/15 118/78   2) Hyperhidrosis- Using, Drysol which is helping significantly. Requesting refill.   Review of Systems  Respiratory: Negative for shortness of breath.   Cardiovascular: Negative for chest pain.       Some ankle swelling (left) due to ortho issue.    Past Medical History  Diagnosis Date  . Hypertension     Social History   Social History  . Marital Status: Divorced    Spouse Name: N/A  . Number of Children: N/A  . Years of Education: N/A   Occupational History  . Not on file.   Social History Main Topics  . Smoking status: Never Smoker   . Smokeless tobacco: Never Used  . Alcohol Use: 1.8 oz/week    3 Glasses of wine per week  . Drug Use: Not on file  . Sexual Activity: Not on file   Other Topics Concern  . 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 Cesarean section      Family History  Problem Relation Age of Onset  . Hypertension Other   . Diabetes Mother   . Hyperlipidemia Mother   . Hypertension Mother   . Heart disease Father   . Hypertension Brother   . Hypertension Brother   . HIV Brother     No Known Allergies  Current Outpatient Prescriptions on File Prior to Visit  Medication Sig Dispense Refill  . Multiple Vitamins-Minerals (MULTIVITAMIN WITH MINERALS) tablet Take 1 tablet by mouth daily.       No current facility-administered medications on file prior to visit.    BP 139/88 mmHg   Pulse 72  Temp(Src) 98.4 F (36.9 C) (Oral)  Resp 16  Ht 5' 3.5" (1.613 m)  Wt 146 lb 6.4 oz (66.407 kg)  BMI 25.52 kg/m2  SpO2 100%       Objective:   Physical Exam  Constitutional: She appears well-developed and well-nourished.  Cardiovascular: Normal rate, regular rhythm and normal heart sounds.   No murmur heard. Pulmonary/Chest: Effort normal and breath sounds normal. No respiratory distress. She has no wheezes.  Skin: Skin is warm and dry.  Psychiatric: She has a normal mood and affect. Her behavior is normal. Judgment and thought content normal.          Assessment & Plan:

## 2015-11-08 NOTE — Assessment & Plan Note (Signed)
BP OK, continue healthy diet, exercise- monitor.

## 2015-11-08 NOTE — Progress Notes (Signed)
Pre visit review using our clinic review tool, if applicable. No additional management support is needed unless otherwise documented below in the visit note. 

## 2016-02-11 ENCOUNTER — Other Ambulatory Visit: Payer: Self-pay | Admitting: Family

## 2016-02-11 DIAGNOSIS — Z1231 Encounter for screening mammogram for malignant neoplasm of breast: Secondary | ICD-10-CM

## 2016-02-14 ENCOUNTER — Encounter: Payer: Self-pay | Admitting: Family

## 2016-02-14 ENCOUNTER — Ambulatory Visit (INDEPENDENT_AMBULATORY_CARE_PROVIDER_SITE_OTHER): Payer: Managed Care, Other (non HMO) | Admitting: Family

## 2016-02-14 VITALS — BP 130/84 | HR 54 | Temp 98.6°F | Resp 18 | Ht 64.0 in | Wt 153.4 lb

## 2016-02-14 DIAGNOSIS — Z0000 Encounter for general adult medical examination without abnormal findings: Secondary | ICD-10-CM

## 2016-02-14 LAB — TSH: TSH: 0.68 u[IU]/mL (ref 0.35–4.50)

## 2016-02-14 LAB — URINALYSIS, ROUTINE W REFLEX MICROSCOPIC
Bilirubin Urine: NEGATIVE
Hgb urine dipstick: NEGATIVE
Ketones, ur: NEGATIVE
Leukocytes, UA: NEGATIVE
Nitrite: NEGATIVE
RBC / HPF: NONE SEEN (ref 0–?)
Specific Gravity, Urine: <=1.005 — AB (ref 1.000–1.030)
Total Protein, Urine: NEGATIVE
Urine Glucose: NEGATIVE
Urobilinogen, UA: 0.2 (ref 0.0–1.0)
WBC, UA: NONE SEEN (ref 0–?)
pH: 7 (ref 5.0–8.0)

## 2016-02-14 LAB — BASIC METABOLIC PANEL
BUN: 8 mg/dL (ref 6–23)
CO2: 30 mEq/L (ref 19–32)
Calcium: 9.7 mg/dL (ref 8.4–10.5)
Chloride: 105 mEq/L (ref 96–112)
Creatinine, Ser: 0.78 mg/dL (ref 0.40–1.20)
GFR: 101.61 mL/min (ref >60.00)
Glucose, Bld: 97 mg/dL (ref 70–99)
Potassium: 3.8 mEq/L (ref 3.5–5.1)
Sodium: 141 mEq/L (ref 135–145)

## 2016-02-14 LAB — HEPATIC FUNCTION PANEL
ALT: 22 U/L (ref 0–35)
AST: 28 U/L (ref 0–37)
Albumin: 4.5 g/dL (ref 3.5–5.2)
Alkaline Phosphatase: 91 U/L (ref 39–117)
Bilirubin, Direct: 0.1 mg/dL (ref 0.0–0.3)
Total Bilirubin: 1 mg/dL (ref 0.2–1.2)
Total Protein: 7.5 g/dL (ref 6.0–8.3)

## 2016-02-14 LAB — CBC WITH DIFFERENTIAL/PLATELET
Basophils Absolute: 0.1 10*3/uL (ref 0.0–0.1)
Basophils Relative: 1.1 % (ref 0.0–3.0)
Eosinophils Absolute: 0.2 10*3/uL (ref 0.0–0.7)
Eosinophils Relative: 3.2 % (ref 0.0–5.0)
HCT: 39.5 % (ref 36.0–46.0)
Hemoglobin: 13 g/dL (ref 12.0–15.0)
Lymphocytes Relative: 42 % (ref 12.0–46.0)
Lymphs Abs: 2.1 10*3/uL (ref 0.7–4.0)
MCHC: 32.9 g/dL (ref 30.0–36.0)
MCV: 78.8 fl (ref 78.0–100.0)
Monocytes Absolute: 0.4 10*3/uL (ref 0.1–1.0)
Monocytes Relative: 8.6 % (ref 3.0–12.0)
Neutro Abs: 2.3 10*3/uL (ref 1.4–7.7)
Neutrophils Relative %: 45.1 % (ref 43.0–77.0)
Platelets: 327 10*3/uL (ref 150.0–400.0)
RBC: 5.01 Mil/uL (ref 3.87–5.11)
RDW: 14.7 % (ref 11.5–15.5)
WBC: 5 10*3/uL (ref 4.0–10.5)

## 2016-02-14 LAB — LIPID PANEL
Cholesterol: 213 mg/dL — ABNORMAL HIGH (ref 0–200)
HDL: 61.5 mg/dL (ref >39.00)
LDL Cholesterol: 142 mg/dL — ABNORMAL HIGH (ref 0–99)
NonHDL: 151.21
Total CHOL/HDL Ratio: 3
Triglycerides: 48 mg/dL (ref 0.0–149.0)
VLDL: 9.6 mg/dL (ref 0.0–40.0)

## 2016-02-14 NOTE — Progress Notes (Signed)
Pre visit review using our clinic review tool, if applicable. No additional management support is needed unless otherwise documented below in the visit note. 

## 2016-02-14 NOTE — Assessment & Plan Note (Signed)
Reinforced importance of healthy diet, exercise efforts.  Obtain routine labs today. Immunizations reviewed and up to date. Refer for mammogram.

## 2016-02-14 NOTE — Patient Instructions (Signed)
Please complete lab work prior to leaving. Continue to monitor your blood pressure at home and let me know if you get multiple readings >140/90.   Continue to work on [employer], exercise.

## 2016-02-14 NOTE — Progress Notes (Signed)
Subjective:    Patient ID: Jodi Hanson, female    DOB: Feb 08, 1966, 50 y.o.   MRN: [id number]  HPI  Patient presents today for complete physical.  Immunizations: up to date Diet:healthy diet Exercise: some exercise, 2-4 times a week.  Pap Smear: 2015- this was done 2015.  Mammogram: due- has [facility] the [facility] Dental:  Up to date Vision: up to date  Review of Systems  Constitutional: Negative for unexpected weight change.  HENT: Negative for hearing loss and rhinorrhea.   Eyes: Negative for visual disturbance.  Respiratory: Negative for cough.   Cardiovascular: Negative for leg swelling.  Gastrointestinal: Negative for diarrhea and constipation.  Genitourinary: Negative for dysuria and frequency.  Musculoskeletal: Negative for myalgias.       Some left ankle pain with running  Skin: Negative for rash.  Neurological: Negative for headaches.  Hematological: Negative for adenopathy.  Psychiatric/Behavioral:       Denies depression/anxiety   Past Medical History  Diagnosis Date  . Hypertension      Social History   Social History  . Marital Status: Divorced    Spouse Name: N/A  . Number of Children: N/A  . Years of Education: N/A   Occupational History  . Not on file.   Social History Main Topics  . Smoking status: Never Smoker   . Smokeless tobacco: Never Used  . Alcohol Use: 1.8 oz/week    3 Glasses of wine per week  . Drug Use: Not on file  . Sexual Activity: Not on file   Other Topics Concern  . 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 Cesarean section      Family History  Problem Relation Age of Onset  . Hypertension Other   . Diabetes Mother   . Hyperlipidemia Mother   . Hypertension Mother   . Stroke Mother   . Heart disease  Father   . Hypertension Brother   . Hypertension Brother   . HIV Brother     No Known Allergies  Current Outpatient Prescriptions on File Prior to Visit  Medication Sig Dispense Refill  . aluminum chloride (DRYSOL) 20 % external solution Apply topically at bedtime. 35 mL 5  . Multiple Vitamins-Minerals (MULTIVITAMIN WITH MINERALS) tablet Take 1 tablet by mouth daily.       No current facility-administered medications on file prior to visit.    BP 130/84 mmHg  Pulse 54  Temp(Src) 98.6 F (37 C) (Oral)  Resp 18  Ht 5\' 4"  (1.626 m)  Wt 153 lb 6.4 oz (69.582 kg)  BMI 26.32 kg/m2  SpO2 100%       Objective:   Physical Exam  Physical Exam  Constitutional: She is oriented to person, place, and time. She appears well-developed and well-nourished. No distress.  HENT:  Head: Normocephalic and atraumatic.  Right Ear: Tympanic membrane and ear canal normal.  Left Ear: Tympanic membrane and ear canal normal.  Mouth/Throat: Oropharynx is clear and moist.  Eyes: Pupils are equal, round, and reactive to light. No scleral icterus.  Neck: Normal range of motion. No thyromegaly present.  Cardiovascular: Normal rate and regular rhythm.   No murmur heard. Pulmonary/Chest: Effort normal and breath sounds normal. No respiratory distress. He  has no wheezes. She has no rales. She exhibits no tenderness.  Abdominal: Soft. Bowel sounds are normal. He exhibits no distension and no mass. There is no tenderness. There is no rebound and no guarding.  Musculoskeletal: She exhibits no edema.  Lymphadenopathy:    She has no cervical adenopathy.  Neurological: She is alert and oriented to person, place, and time. She has normal patellar reflexes. She exhibits normal muscle tone. Coordination normal.  Skin: Skin is warm and dry.  Psychiatric: She has a normal mood and affect. Her behavior is normal. Judgment and thought content normal.  Breasts: Examined lying Right: Without masses, retractions,  discharge or axillary adenopathy.  Left: Without masses, retractions, discharge or axillary adenopathy.  Pelvic: deferred          Assessment & Plan:         Assessment & Plan:  EKG tracing is personally reviewed.  EKG notes NSR- sinus brady (pt is a runner/asymptomatic).  No acute changes.

## 2016-03-04 ENCOUNTER — Other Ambulatory Visit: Payer: Self-pay

## 2016-03-04 DIAGNOSIS — Z1231 Encounter for screening mammogram for malignant neoplasm of breast: Secondary | ICD-10-CM

## 2016-03-05 ENCOUNTER — Ambulatory Visit
Admission: RE | Admit: 2016-03-05 | Discharge: 2016-03-05 | Disposition: A | Discharge status: Home / Self Care | Payer: Managed Care, Other (non HMO) | Source: Ambulatory Visit

## 2016-03-05 ENCOUNTER — Ambulatory Visit (HOSPITAL_BASED_OUTPATIENT_CLINIC_OR_DEPARTMENT_OTHER): Payer: Managed Care, Other (non HMO)

## 2016-03-05 DIAGNOSIS — Z1231 Encounter for screening mammogram for malignant neoplasm of breast: Secondary | ICD-10-CM

## 2016-05-15 ENCOUNTER — Encounter: Payer: Self-pay | Admitting: Family

## 2016-05-15 ENCOUNTER — Encounter: Payer: Self-pay | Admitting: Family Medicine

## 2016-05-15 ENCOUNTER — Ambulatory Visit (INDEPENDENT_AMBULATORY_CARE_PROVIDER_SITE_OTHER): Payer: Managed Care, Other (non HMO) | Admitting: Family

## 2016-05-15 ENCOUNTER — Ambulatory Visit (INDEPENDENT_AMBULATORY_CARE_PROVIDER_SITE_OTHER): Payer: Managed Care, Other (non HMO) | Admitting: Family Medicine

## 2016-05-15 VITALS — BP 128/80 | HR 63 | Temp 98.8°F | Resp 16 | Ht 64.0 in | Wt 148.2 lb

## 2016-05-15 DIAGNOSIS — M79672 Pain in left foot: Secondary | ICD-10-CM

## 2016-05-15 DIAGNOSIS — M79673 Pain in unspecified foot: Secondary | ICD-10-CM | POA: Diagnosis not present

## 2016-05-15 DIAGNOSIS — I10 Essential (primary) hypertension: Secondary | ICD-10-CM | POA: Diagnosis not present

## 2016-05-15 NOTE — Progress Notes (Signed)
   Subjective:    Patient ID: Jodi Hanson, female    DOB: 1966-09-26, 50 y.o.   MRN: [id number]  HPI  HTN- Patient reports that she has been training for a half marathon. She has been watching her diet as well.   BP Readings from Last 3 Encounters:  05/15/16 128/80  02/14/16 130/84  11/08/15 139/88   Foot pain-  Left arch pain.  Has used ibuprofen once.    Review of Systems    see HPI  Past Medical History:  Diagnosis Date  . Hypertension      Social History   Social History  . Marital status: Divorced    Spouse name: N/A  . Number of children: N/A  . Years of education: N/A   Occupational History  . Not on file.   Social History Main Topics  . Smoking status: Never Smoker  . Smokeless tobacco: Never Used  . Alcohol use 1.8 oz/week    3 Glasses of wine per week  . Drug use: Unknown  . Sexual activity: Not on file   Other Topics Concern  . 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 CESAREAN SECTION      Family History  Problem Relation Age of Onset  . Hypertension Other   . Diabetes Mother   . Hyperlipidemia Mother   . Hypertension Mother   . Stroke Mother   . Heart disease Father   . Hypertension Brother   . Hypertension Brother   . HIV Brother     No Known Allergies  Current Outpatient Prescriptions on File Prior to Visit  Medication Sig Dispense Refill  . aluminum chloride (DRYSOL) 20 % external solution Apply topically at bedtime. 35 mL 5  . Multiple Vitamins-Minerals (MULTIVITAMIN WITH MINERALS) tablet Take 1 tablet by mouth daily.       No current facility-administered medications on file prior to visit.     BP 128/80 (BP Location: Right Arm, Cuff Size: Normal)   Pulse 63   Temp 98.8 F (37.1 C) (Oral)   Resp 16   Ht 5\' 4"  (1.626  m)   Wt 148 lb 3.2 oz (67.2 kg)   SpO2 98% Comment: room air  BMI 25.44 kg/m    Objective:   Physical Exam  Constitutional: She is oriented to person, place, and time. She appears well-developed and well-nourished. No distress.  Cardiovascular: Normal rate and regular rhythm.   No murmur heard. Pulmonary/Chest: Effort normal and breath sounds normal. No respiratory distress. She has no wheezes. She has no rales.  Neurological: She is alert and oriented to person, place, and time.  Psychiatric: She has a normal mood and affect. Her behavior is normal. Judgment and thought content normal.          Assessment & Plan:  Left foot pain- Patient wishes to schedule an appointment with Dr. Barbaraann Barthel sports medicine and will schedule today on her way out.

## 2016-05-15 NOTE — Assessment & Plan Note (Signed)
Stable off of meds, continue diet and exercise.

## 2016-05-15 NOTE — Progress Notes (Signed)
Pre visit review using our clinic review tool, if applicable. No additional management support is needed unless otherwise documented below in the visit note. 

## 2016-05-15 NOTE — Patient Instructions (Addendum)
Please schedule an appointment with Dr. Barbaraann Barthel (sports medicine) on the third floor. Great job with exercise- keep up the good work.

## 2016-05-19 NOTE — Assessment & Plan Note (Signed)
in long arches without current pain.  Related to pes planus primarily.  Did well with arch support previously and wanted to go ahead with custom orthotics today which felt comfortable.  Patient was fitted for a : standard, cushioned, semi-rigid orthotic. The orthotic was heated and afterward the patient stood on the orthotic blank positioned on the orthotic stand. The patient was positioned in subtalar neutral position and 10 degrees of ankle dorsiflexion in a weight bearing stance. After completion of molding, a stable base was applied to the orthotic blank. The blank was ground to a stable position for weight bearing. Size: 10 blue swirl Base: blue med density eva Posting: none Additional orthotic padding: none Total prep time 40 minutes

## 2016-05-19 NOTE — Progress Notes (Signed)
PCP and consultation requested by: Nance Pear., NP  Subjective:   HPI: Patient is a 50 y.o. female here for bilateral foot pain.  Patient reports she's had bilateral foot pain for 6 months. Feels pain within long arches. Pain currently 0/10. Worse after running and first thing in the morning, dull. Tried ibuprofen. Last ran on Sunday. Previously had sports insoles with scaphoid pads - did well with these but never had custom orthotics made - interested in these. No skin changes, numbness.  Past Medical History:  Diagnosis Date  . Hypertension     Current Outpatient Prescriptions on File Prior to Visit  Medication Sig Dispense Refill  . aluminum chloride (DRYSOL) 20 % external solution Apply topically at bedtime. 35 mL 5  . Multiple Vitamins-Minerals (MULTIVITAMIN WITH MINERALS) tablet Take 1 tablet by mouth daily.       No current facility-administered medications on file prior to visit.     Past Surgical History:  Procedure Laterality Date  . CESAREAN SECTION      No Known Allergies  Social History   Social History  . Marital status: Divorced    Spouse name: N/A  . Number of children: N/A  . Years of education: N/A   Occupational History  . Not on file.   Social History Main Topics  . Smoking status: Never Smoker  . Smokeless tobacco: Never Used  . Alcohol use 1.8 oz/week    3 Glasses of wine per week  . Drug use: Unknown  . Sexual activity: Not on file   Other Topics Concern  . 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 Hypertension Other   . Diabetes Mother   . Hyperlipidemia Mother   . Hypertension Mother   . Stroke Mother   . Heart disease Father   . Hypertension Brother   . Hypertension Brother   . HIV Brother     BP (!) 149/98   Ht  5\' 4"  (1.626 m)   Wt 145 lb (65.8 kg)   BMI 24.89 kg/m   Review of Systems: See HPI above.    Objective:  Physical Exam:  Gen: NAD, comfortable in exam room  Bilateral feet/ankles: Pes planus.  Mild transverse arch collapse also without callus.  No hallux valgus, rigidus.  No other gross deformity, swelling, ecchymoses FROM ankles without pain No TTP currently. Negative ant drawer and talar tilt.   Negative syndesmotic compression. Thompsons test negative. NV intact distally.    Assessment & Plan:  1. Bilateral foot pain - in long arches without current pain.  Related to pes planus primarily.  Did well with arch support previously and wanted to go ahead with custom orthotics today which felt comfortable.  Patient was fitted for a : standard, cushioned, semi-rigid orthotic. The orthotic was heated and afterward the patient stood on the orthotic blank positioned on the orthotic stand. The patient was positioned in subtalar neutral position and 10 degrees of ankle dorsiflexion in a weight bearing stance. After completion of molding, a stable base was applied to the orthotic blank. The blank was ground to a stable position for weight bearing. Size: 10 blue swirl Base: blue med density eva Posting: none Additional orthotic padding: none Total prep time 40 minutes

## 2016-08-11 ENCOUNTER — Telehealth: Payer: Self-pay | Admitting: Family Medicine

## 2016-08-11 NOTE — Telephone Encounter (Signed)
Yes she can drop them off though if they still don't feel right after I try to fit them in there we may have to talk about her being here to try them on, let us know if there are any other issues.  But first step is she can drop them off with her running shoes and we will go from there.

## 2016-09-24 ENCOUNTER — Other Ambulatory Visit: Payer: Self-pay | Admitting: Family

## 2016-09-29 MED ORDER — ALUMINUM CHLORIDE 20 % EX SOLN: CUTANEOUS | 0 refills | Status: DC

## 2016-09-29 NOTE — Telephone Encounter (Signed)
Received fax from pharmacy dated 09/25/16 stating drysol stock bottle is 64mL and to resend Rx for insurance purposes. Rx re-sent.

## 2016-09-29 NOTE — Addendum Note (Signed)
Addended by: Kelle Darting A on: 09/29/2016 02:22 PM   Modules accepted: Orders

## 2016-10-19 ENCOUNTER — Telehealth: Payer: Self-pay | Admitting: *Deleted

## 2016-10-19 DIAGNOSIS — N912 Amenorrhea, unspecified: Secondary | ICD-10-CM

## 2016-10-19 NOTE — Telephone Encounter (Signed)
Pt states she no longer has periods. Has not had one in 7 years. Stopped Depo Provera and never had a cycle after that. 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 Has hot flashes. Wants to know if she can get her hormone levels checked to see if she is post menopausal?

## 2016-10-20 NOTE — Telephone Encounter (Signed)
Notified pt and scheduled lab appt for 10/21/16 at 11am.

## 2016-10-20 NOTE — Telephone Encounter (Signed)
Orders have been placed.  Please ask pt to schedule a lab appointment.

## 2016-10-21 ENCOUNTER — Other Ambulatory Visit (INDEPENDENT_AMBULATORY_CARE_PROVIDER_SITE_OTHER): Payer: 59

## 2016-10-21 DIAGNOSIS — N912 Amenorrhea, unspecified: Secondary | ICD-10-CM | POA: Diagnosis not present

## 2016-10-21 LAB — FOLLICLE STIMULATING HORMONE: FSH: 83.2 m[IU]/mL

## 2016-10-21 LAB — LUTEINIZING HORMONE: LH: 26.03 m[IU]/mL

## 2016-10-22 LAB — ESTRADIOL: Estradiol: <15 pg/mL

## 2016-10-22 LAB — PROGESTERONE: Progesterone: <0.5 ng/mL

## 2016-10-26 NOTE — Telephone Encounter (Signed)
Spoke with pt. She states that she wants to know how long she can expect to be in menopause because she feels like she has already been going through it for 10 years? Wants to know when she will stop having hot flashes and will she need to repeat labs in the future to see where she is?

## 2016-10-26 NOTE — Telephone Encounter (Signed)
Once you enter menopause, you remain in menopause.  Hot flashes can last for several years after onset of menopause.  There are some non-hormonal regimens we can try for hot flashes.  She would need OV to discuss. If she is interested in hormone replacement therapy, I would recommend referral to GYN.  Just let me know which she prefers.

## 2016-10-26 NOTE — Telephone Encounter (Signed)
Notified pt. She wishes to pursue non-hormonal therapy at this time. Scheduled appt for Friday at 8:15am with PCP.

## 2016-10-26 NOTE — Telephone Encounter (Signed)
Patient returning call.

## 2016-10-30 ENCOUNTER — Encounter: Payer: Self-pay | Admitting: Family

## 2016-10-30 ENCOUNTER — Ambulatory Visit (INDEPENDENT_AMBULATORY_CARE_PROVIDER_SITE_OTHER): Payer: 59 | Admitting: Family

## 2016-10-30 DIAGNOSIS — R232 Flushing: Secondary | ICD-10-CM | POA: Diagnosis not present

## 2016-10-30 MED ORDER — GABAPENTIN 300 MG PO CAPS: 300.0000 mg | ORAL_CAPSULE | Freq: Every day | ORAL | 2 refills | Status: DC

## 2016-10-30 NOTE — Progress Notes (Signed)
   Subjective:    Patient ID: Jodi Hanson, female    DOB: Sep 28, 1966, 51 y.o.   MRN: [id number]  HPI  Ms. Zoto is a 51 yr old female who presents today to discuss hot flashes.  Hormonal testing revealed that patient is currently menopausal.  She reports that her hot flashes began around age 83. She reports that she was told by her GYN at that time that she had premature ovarian failure. Reports that she was treated with OCP's until age 59.  Reports that her hot flashes as mainly at night.  She wishes to avoid hormonal therapy.     Review of Systems    see HPI  Past Medical History:  Diagnosis Date  . Hypertension      Social History   Social History  . Marital status: Divorced    Spouse name: N/A  . Number of children: N/A  . Years of education: N/A   Occupational History  . Not on file.   Social History Main Topics  . Smoking status: Never Smoker  . Smokeless tobacco: Never Used  . Alcohol use 1.8 oz/week    3 Glasses of wine per week  . Drug use: Unknown  . Sexual activity: Not on file   Other Topics Concern  . 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 CESAREAN SECTION      Family History  Problem Relation Age of Onset  . Hypertension Other   . Diabetes Mother   . Hyperlipidemia Mother   . Hypertension Mother   . Stroke Mother   . Heart disease Father   . Hypertension Brother   . Hypertension Brother   . HIV Brother     No Known Allergies  Current Outpatient Prescriptions on File Prior to Visit  Medication Sig Dispense Refill  . aluminum chloride (DRYSOL) 20 % external solution APPLY  SOLUTION TOPICALLY AT BEDTIME 38 mL 0  . Multiple Vitamins-Minerals (MULTIVITAMIN WITH MINERALS) tablet Take 1 tablet by mouth daily.       No current  facility-administered medications on file prior to visit.     BP (!) 141/85 (BP Location: Left Arm, Cuff Size: Normal)   Pulse 60   Temp 98.6 F (37 C) (Oral)   Ht 5\' 4"  (1.626 m)   Wt 151 lb (68.5 kg)   SpO2 100%   BMI 25.92 kg/m    Objective:   Physical Exam  Constitutional: She is oriented to person, place, and time. She appears well-developed and well-nourished.  HENT:  Head: Normocephalic and atraumatic.  Cardiovascular: Normal rate, regular rhythm and normal heart sounds.   No murmur heard. Pulmonary/Chest: Effort normal and breath sounds normal. No respiratory distress. She has no wheezes.  Musculoskeletal: She exhibits no edema.  Neurological: She is alert and oriented to person, place, and time.  Skin: Skin is warm.  Psychiatric: She has a normal mood and affect. Her behavior is normal. Judgment and thought content normal.          Assessment & Plan:

## 2016-10-30 NOTE — Patient Instructions (Signed)
Please begin gabapentin at bedtime for hot flashes.

## 2016-10-30 NOTE — Progress Notes (Signed)
Pre visit review using our clinic review tool, if applicable. No additional management support is needed unless otherwise documented below in the visit note. 

## 2016-10-30 NOTE — Assessment & Plan Note (Signed)
Will give trial of gabapentin.  15 min spent with pt today.  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

## 2017-01-01 ENCOUNTER — Ambulatory Visit: Payer: 59 | Admitting: Family

## 2017-01-08 ENCOUNTER — Encounter: Payer: Self-pay | Admitting: Family

## 2017-01-08 ENCOUNTER — Ambulatory Visit (INDEPENDENT_AMBULATORY_CARE_PROVIDER_SITE_OTHER): Payer: PRIVATE HEALTH INSURANCE | Admitting: Family

## 2017-01-08 DIAGNOSIS — R232 Flushing: Secondary | ICD-10-CM | POA: Diagnosis not present

## 2017-01-08 MED ORDER — VENLAFAXINE HCL ER 37.5 MG PO CP24: 37.5000 mg | ORAL_CAPSULE | Freq: Every day | ORAL | 2 refills | Status: DC

## 2017-01-08 NOTE — Progress Notes (Signed)
Pre visit review using our clinic review tool, if applicable. No additional management support is needed unless otherwise documented below in the visit note. 

## 2017-01-08 NOTE — Patient Instructions (Addendum)
Please schedule a physical with pap smear at the front desk. Stop gabapentin, begin effexor.

## 2017-01-08 NOTE — Progress Notes (Signed)
   Subjective:    Patient ID: Jodi Hanson, female    DOB: Feb 01, 1966, 51 y.o.   MRN: [id number]  HPI  Jodi Hanson is a 51 yr old female who presents today for follow up of her hot flashes. She reports that she began trial of gabapentin as we discussed last visit and has not noticed any improvement in her hot flashes. She does notice that if she has a glass of wine at night she has more hot flashes- so she has tried to cut back on her wine consumption.   Review of Systems    see HPI  Past Medical History:  Diagnosis Date  . Hypertension      Social History   Social History  . Marital status: Divorced    Spouse name: N/A  . Number of children: N/A  . Years of education: N/A   Occupational History  . Not on file.   Social History Main Topics  . Smoking status: Never Smoker  . Smokeless tobacco: Never Used  . Alcohol use 1.8 oz/week    3 Glasses of wine per week  . Drug use: Unknown  . Sexual activity: Not on file   Other Topics Concern  . 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 CESAREAN SECTION      Family History  Problem Relation Age of Onset  . Hypertension Other   . Diabetes Mother   . Hyperlipidemia Mother   . Hypertension Mother   . Stroke Mother   . Heart disease Father   . Hypertension Brother   . Hypertension Brother   . HIV Brother     No Known Allergies  Current Outpatient Prescriptions on File Prior to Visit  Medication Sig Dispense Refill  . aluminum chloride (DRYSOL) 20 % external solution APPLY  SOLUTION TOPICALLY AT BEDTIME 38 mL 0  . Multiple Vitamins-Minerals (MULTIVITAMIN WITH MINERALS) tablet Take 1 tablet by mouth daily.       No current facility-administered medications on file prior to visit.     BP 138/88 (BP Location: Left  Arm, Cuff Size: Normal)   Pulse 63   Temp 98.6 F (37 C) (Oral)   Resp 16   Ht 5\' 4"  (1.626 m)   Wt 151 lb 9.6 oz (68.8 kg)   SpO2 100% Comment: room air  BMI 26.02 kg/m    Objective:   Physical Exam  Constitutional: She is oriented to person, place, and time. She appears well-developed and well-nourished.  HENT:  Head: Normocephalic and atraumatic.  Cardiovascular: Normal rate, regular rhythm and normal heart sounds.   No murmur heard. Pulmonary/Chest: Effort normal and breath sounds normal. No respiratory distress. She has no wheezes.  Neurological: She is alert and oriented to person, place, and time.  Psychiatric: She has a normal mood and affect. Her behavior is normal. Judgment and thought content normal.          Assessment & Plan:

## 2017-01-08 NOTE — Assessment & Plan Note (Addendum)
Uncontrolled. D/c gabapentin, begin low dose effexor. Plan follow up in 6 weeks. She understands to let me know if she has any mood changes on this medication.

## 2017-01-09 ENCOUNTER — Other Ambulatory Visit: Payer: Self-pay | Admitting: Family

## 2017-01-11 NOTE — Telephone Encounter (Signed)
eScribe request from [facility] for refill on Drysol 20% External Solution Last filled - 09/29/16, #38 mL x 0 Last AEX - 01/08/17 Next AEX - 6-Wks, pt has appointment schedule for 02/19/17 Refill sent per [facility] refill protocol/SLS

## 2017-02-19 ENCOUNTER — Encounter: Payer: PRIVATE HEALTH INSURANCE | Admitting: Family

## 2017-02-24 ENCOUNTER — Telehealth: Payer: Self-pay | Admitting: *Deleted

## 2017-02-24 NOTE — Telephone Encounter (Signed)
Letter mailed to pt.  

## 2017-02-24 NOTE — Telephone Encounter (Signed)
Faxed refill request received from [facility] for 90-day supply Venlafaxine ER 37.5 mg caps Last filled by MD on 01/08/17, #30x2 Last AEX - 01/08/17 Next AEX - 6-Wks., Rana Snare 03/03/17 appt, no future scheduled] Please Advise on refills/SLS 05/30

## 2017-02-24 NOTE — Telephone Encounter (Signed)
Needs OV prior to additional refills.  

## 2017-03-03 ENCOUNTER — Encounter: Payer: PRIVATE HEALTH INSURANCE | Admitting: Family

## 2017-10-07 ENCOUNTER — Telehealth: Payer: Self-pay | Admitting: Family

## 2017-10-07 NOTE — Telephone Encounter (Signed)
Called pt to reschedule her CPE appt because PCP will be out of the office. Pt said that she would call back to reschedule. States that she needs early morning or late afternoon.

## 2017-10-08 ENCOUNTER — Encounter: Payer: PRIVATE HEALTH INSURANCE | Admitting: Family

## 2017-10-15 ENCOUNTER — Encounter: Payer: PRIVATE HEALTH INSURANCE | Admitting: Family

## 2017-10-15 DIAGNOSIS — Z0289 Encounter for other administrative examinations: Secondary | ICD-10-CM

## 2017-11-18 LAB — HM PAP SMEAR: HM Pap smear: NEGATIVE

## 2018-11-14 ENCOUNTER — Encounter: Payer: Self-pay | Admitting: Family

## 2018-11-14 ENCOUNTER — Telehealth: Payer: Self-pay | Admitting: Family

## 2018-11-14 ENCOUNTER — Ambulatory Visit (INDEPENDENT_AMBULATORY_CARE_PROVIDER_SITE_OTHER): Payer: PRIVATE HEALTH INSURANCE | Admitting: Family

## 2018-11-14 VITALS — BP 137/85 | HR 68 | Temp 99.2°F | Resp 16 | Ht 64.0 in | Wt 145.0 lb

## 2018-11-14 DIAGNOSIS — R9431 Abnormal electrocardiogram [ECG] [EKG]: Secondary | ICD-10-CM

## 2018-11-14 DIAGNOSIS — J069 Acute upper respiratory infection, unspecified: Secondary | ICD-10-CM | POA: Diagnosis not present

## 2018-11-14 DIAGNOSIS — Z0000 Encounter for general adult medical examination without abnormal findings: Secondary | ICD-10-CM | POA: Diagnosis not present

## 2018-11-14 DIAGNOSIS — B9789 Other viral agents as the cause of diseases classified elsewhere: Secondary | ICD-10-CM

## 2018-11-14 LAB — BASIC METABOLIC PANEL
BUN: 7 mg/dL (ref 6–23)
CO2: 32 mEq/L (ref 19–32)
Calcium: 9.4 mg/dL (ref 8.4–10.5)
Chloride: 101 mEq/L (ref 96–112)
Creatinine, Ser: 0.81 mg/dL (ref 0.40–1.20)
GFR: 90.49 mL/min (ref >60.00)
Glucose, Bld: 76 mg/dL (ref 70–99)
Potassium: 5.4 mEq/L — ABNORMAL HIGH (ref 3.5–5.1)
Sodium: 141 mEq/L (ref 135–145)

## 2018-11-14 LAB — LIPID PANEL
Cholesterol: 191 mg/dL (ref 0–200)
HDL: 67 mg/dL (ref >39.00)
LDL Cholesterol: 113 mg/dL — ABNORMAL HIGH (ref 0–99)
NonHDL: 124.03
Total CHOL/HDL Ratio: 3
Triglycerides: 57 mg/dL (ref 0.0–149.0)
VLDL: 11.4 mg/dL (ref 0.0–40.0)

## 2018-11-14 LAB — HEPATIC FUNCTION PANEL
ALT: 53 U/L — ABNORMAL HIGH (ref 0–35)
AST: 73 U/L — ABNORMAL HIGH (ref 0–37)
Albumin: 4.1 g/dL (ref 3.5–5.2)
Alkaline Phosphatase: 94 U/L (ref 39–117)
Bilirubin, Direct: 0.1 mg/dL (ref 0.0–0.3)
Total Bilirubin: 0.7 mg/dL (ref 0.2–1.2)
Total Protein: 7.1 g/dL (ref 6.0–8.3)

## 2018-11-14 LAB — URINALYSIS, ROUTINE W REFLEX MICROSCOPIC
Bilirubin Urine: NEGATIVE
Hgb urine dipstick: NEGATIVE
Ketones, ur: NEGATIVE
Leukocytes,Ua: NEGATIVE
Nitrite: NEGATIVE
Specific Gravity, Urine: 1.01 (ref 1.000–1.030)
Total Protein, Urine: NEGATIVE
Urine Glucose: NEGATIVE
Urobilinogen, UA: 0.2 (ref 0.0–1.0)
pH: 7.5 (ref 5.0–8.0)

## 2018-11-14 LAB — CBC WITH DIFFERENTIAL/PLATELET
Basophils Absolute: 0.1 10*3/uL (ref 0.0–0.1)
Basophils Relative: 1 % (ref 0.0–3.0)
Eosinophils Absolute: 0.1 10*3/uL (ref 0.0–0.7)
Eosinophils Relative: 2.1 % (ref 0.0–5.0)
HCT: 38.1 % (ref 36.0–46.0)
Hemoglobin: 12.4 g/dL (ref 12.0–15.0)
Lymphocytes Relative: 38.9 % (ref 12.0–46.0)
Lymphs Abs: 2.4 10*3/uL (ref 0.7–4.0)
MCHC: 32.6 g/dL (ref 30.0–36.0)
MCV: 78.8 fl (ref 78.0–100.0)
Monocytes Absolute: 0.6 10*3/uL (ref 0.1–1.0)
Monocytes Relative: 9.8 % (ref 3.0–12.0)
Neutro Abs: 3 10*3/uL (ref 1.4–7.7)
Neutrophils Relative %: 48.2 % (ref 43.0–77.0)
Platelets: 403 10*3/uL — ABNORMAL HIGH (ref 150.0–400.0)
RBC: 4.83 Mil/uL (ref 3.87–5.11)
RDW: 14.7 % (ref 11.5–15.5)
WBC: 6.3 10*3/uL (ref 4.0–10.5)

## 2018-11-14 MED ORDER — BENZONATATE 100 MG PO CAPS: 100.0000 mg | ORAL_CAPSULE | Freq: Three times a day (TID) | ORAL | 0 refills | Status: DC

## 2018-11-14 NOTE — Telephone Encounter (Signed)
Also need mammogram results please

## 2018-11-14 NOTE — Telephone Encounter (Signed)
Please let pt know that I reviewed her 2d echo and it notes possible enlarged heart. I would like her to complete a 2D echo.

## 2018-11-14 NOTE — Telephone Encounter (Signed)
[facility] call for [facility] and request copy of pap and bone density.

## 2018-11-14 NOTE — Patient Instructions (Signed)
Please complete lab work prior to leaving. Continue healthy diet, exercise.  

## 2018-11-14 NOTE — Progress Notes (Signed)
Subjective:    Patient ID: Jodi Hanson, female    DOB: [birth year], 53 y.o.   MRN: [id number]  HPI  Patient is a 53 yr old female who presents today for cpx.  Patient presents today for complete physical.  Immunizations: tetanus due Diet: reports diet is fair Exercise: goes to the gym and hot yoga Wt Readings from Last 3 Encounters:  11/14/18 145 lb (65.8 kg)  01/08/17 151 lb 9.6 oz (68.8 kg)  10/30/16 151 lb (68.5 kg)  Colonoscopy: due LMP at age 63, had irregular periods prior. Reports that she had bone density at gyn.  Told to take calcium Pap Smear: 3/19 Mammogram: last year  Cough, started over the weekend.  Scratchy throat. No fever or body aches.  +HA from coughing so hard.    Review of Systems  Constitutional: Negative for unexpected weight change.  HENT: Positive for rhinorrhea.   Respiratory: Positive for cough. Negative for shortness of breath.   Cardiovascular: Negative for chest pain.  Gastrointestinal: Positive for constipation. Negative for blood in stool and diarrhea.  Genitourinary: Negative for dysuria, frequency and hematuria.  Musculoskeletal: Negative for arthralgias and myalgias.  Skin: Negative for rash.  Neurological:       Rare headaches  Hematological: Negative for adenopathy.  Psychiatric/Behavioral:       Denies depression/anxiety   Past Medical History:  Diagnosis Date  . Hypertension      Social History   Socioeconomic History  . Marital status: Divorced    Spouse name: Not on file  . Number of children: Not on file  . Years of education: Not on file  . Highest education level: Not on file  Occupational History  . Not on file  Social Needs  . Financial resource strain: Not on file  . Food insecurity:    Worry: Not on file    Inability: Not on file  . Transportation needs:    Medical: Not on file    Non-medical: Not on file  Tobacco Use  . Smoking status: Never Smoker  . Smokeless tobacco: Never Used  Substance and  Sexual Activity  . Alcohol use: Yes    Alcohol/week: 3.0 standard drinks    Types: 3 Glasses of wine per week  . Drug use: Not on file  . Sexual activity: Not on file  Lifestyle  . Physical activity:    Days per week: Not on file    Minutes per session: Not on file  . Stress: Not on file  Relationships  . Social connections:    Talks on phone: Not on file    Gets together: Not on file    Attends religious service: Not on file    Active member of club or organization: Not on file    Attends meetings of clubs or organizations: Not on file    Relationship status: Not on file  . Intimate partner violence:    Fear of current or ex partner: Not on file    Emotionally abused: Not on file    Physically abused: Not on file    Forced sexual activity: Not on file  Other Topics Concern  . 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 CESAREAN SECTION  Family History  Problem Relation Age of Onset  . Hypertension Other   . Diabetes Mother   . Hyperlipidemia Mother   . Hypertension Mother   . Stroke Mother   . Heart disease Father   . Hypertension Brother   . Hypertension Brother   . HIV Brother     No Known Allergies  Current Outpatient Medications on File Prior to Visit  Medication Sig Dispense Refill  . aluminum chloride (DRYSOL) 20 % external solution APPLY SOLUTION TOPICALLY AT BEDTIME 38 mL 0  . Multiple Vitamins-Minerals (MULTIVITAMIN WITH MINERALS) tablet Take 1 tablet by mouth daily.       No current facility-administered medications on file prior to visit.     BP 137/85 (BP Location: Right Arm, Patient Position: Sitting, Cuff Size: Small)   Pulse 68   Temp 99.2 F (37.3 C) (Oral)   Resp 16   Ht 5\' 4"  (1.626 m)   Wt 145 lb (65.8 kg)   SpO2 100%   BMI 24.89  kg/m       Objective:   Physical Exam  Physical Exam  Constitutional: She is oriented to person, place, and time. She appears well-developed and well-nourished. No distress.  HENT:  Head: Normocephalic and atraumatic.  Right Ear: Tympanic membrane and ear canal normal.  Left Ear: Tympanic membrane and ear canal normal.  Mouth/Throat: Oropharynx is clear and moist.  Eyes: Pupils are equal, round, and reactive to light. No scleral icterus.  Neck: Normal range of motion. No thyromegaly present.  Cardiovascular: Normal rate and regular rhythm.   No murmur heard. Pulmonary/Chest: Effort normal and breath sounds normal. No respiratory distress. He has no wheezes. She has no rales. She exhibits no tenderness.  Abdominal: Soft. Bowel sounds are normal. She exhibits no distension and no mass. There is no tenderness. There is no rebound and no guarding.  Musculoskeletal: She exhibits no edema.  Lymphadenopathy:    She has no cervical adenopathy.  Neurological: She is alert and oriented to person, place, and time. She has normal patellar reflexes. She exhibits normal muscle tone. Coordination normal.  Skin: Skin is warm and dry.  Psychiatric: She has a normal mood and affect. Her behavior is normal. Judgment and thought content normal.  Breasts: Examined lying Right: Without masses, retractions, discharge or axillary adenopathy.  Left: Without masses, retractions, discharge or axillary adenopathy.  Pelvic: deferred           Assessment & Plan:  Preventative care-   Physical Exam  Constitutional: She is oriented to person, place, and time. She appears well-developed and well-nourished. No distress.  HENT:  Head: Normocephalic and atraumatic.  Right Ear: Tympanic membrane and ear canal normal.  Left Ear: Tympanic membrane and ear canal normal.  Mouth/Throat: Oropharynx is clear and moist.  Eyes: Pupils are equal, round, and reactive to light. No scleral icterus.  Neck: Normal  range of motion. No thyromegaly present.  Cardiovascular: Normal rate and regular rhythm.   No murmur heard. Pulmonary/Chest: Effort normal and breath sounds normal. No respiratory distress. He has no wheezes. She has no rales. She exhibits no tenderness.  Abdominal: Soft. Bowel sounds are normal. She exhibits no distension and no mass. There is no tenderness. There is no rebound and no guarding.  Musculoskeletal: She exhibits no edema.  Lymphadenopathy:    She has no cervical adenopathy.  Neurological: She is alert and oriented to person, place, and time. She has normal patellar reflexes. She exhibits normal muscle tone. Coordination normal.  Skin: Skin is warm and dry.  Psychiatric: She has a normal mood and affect. Her behavior is normal. Judgment and thought content normal.  Breasts: Examined lying Right: Without masses, retractions, discharge or axillary adenopathy.  Left: Without masses, retractions, discharge or axillary adenopathy.     Assessment & Plan:   Preventative care- Encouraged pt to continue healthy diet/exercise efforts. Obtain routine lab work.       Assessment & Plan:  Abnormal EKG-EKG tracing is personally reviewed.  EKG notes NSR and ? LVH.  Will refer for 2D echo.    Viral uri with cough- Advised tessalon prn, Call if symptoms worsen or if not improved in 3 days.

## 2018-11-15 NOTE — Telephone Encounter (Signed)
Request faxed to [facility]  At [phone]

## 2018-11-16 ENCOUNTER — Telehealth: Payer: Self-pay | Admitting: Family

## 2018-11-16 ENCOUNTER — Other Ambulatory Visit: Payer: Self-pay

## 2018-11-16 DIAGNOSIS — R7989 Other specified abnormal findings of blood chemistry: Secondary | ICD-10-CM

## 2018-11-16 DIAGNOSIS — R945 Abnormal results of liver function studies: Secondary | ICD-10-CM

## 2018-11-16 DIAGNOSIS — E875 Hyperkalemia: Secondary | ICD-10-CM

## 2018-11-16 LAB — TSH: TSH: 0.86 u[IU]/mL (ref 0.35–4.50)

## 2018-11-16 NOTE — Telephone Encounter (Signed)
Results given to patient in detail. Order for [place] signed. Advised to dc any k supplements, she will be here Friday for bmet.

## 2018-11-16 NOTE — Telephone Encounter (Signed)
Note clarified with Melissa and patient advised she review ekg and will like to get a 2D echo.

## 2018-11-16 NOTE — Telephone Encounter (Signed)
Lab work shows that her liver function tests are mildly elevated.  I would recommend abdominal ultrasound to further evaluate.  Referral pended.   Potassium is also pended.  Please check that she is not taking any supplements that may contain potassium.  Obtain follow up bmet this week please.  Might have been false reading/hemolyzed sample.

## 2018-11-18 ENCOUNTER — Other Ambulatory Visit: Payer: PRIVATE HEALTH INSURANCE

## 2018-11-19 ENCOUNTER — Encounter: Payer: Self-pay | Admitting: Family

## 2018-11-21 NOTE — Telephone Encounter (Signed)
Copied from [place] [phone]. Topic: General - Other >> Nov 21, 2018  5:01 PM Percell Belt A wrote: Reason for CRM: Pt called in to check on the status of the my chart message she sent this weekend?  She stated she is not any better and would like to know what she needs to do . Please reference my chart message 11/17/2018

## 2018-11-22 MED ORDER — AMOXICILLIN-POT CLAVULANATE 875-125 MG PO TABS: 1.0000 | ORAL_TABLET | Freq: Two times a day (BID) | ORAL | 0 refills | Status: DC

## 2018-11-22 NOTE — Telephone Encounter (Signed)
I spoke to patient and let her know that I sent rx to her pharmacy for augmentin. She reports + nasal congestion, drainage, mucous, though cough is better. Congestion has been present >2 weeks. Advised pt to let me know if symptoms fail to improve with augmentin. She verbalizes understanding.

## 2018-11-23 ENCOUNTER — Other Ambulatory Visit (INDEPENDENT_AMBULATORY_CARE_PROVIDER_SITE_OTHER): Payer: PRIVATE HEALTH INSURANCE

## 2018-11-23 ENCOUNTER — Telehealth: Payer: Self-pay | Admitting: Family

## 2018-11-23 ENCOUNTER — Ambulatory Visit (HOSPITAL_BASED_OUTPATIENT_CLINIC_OR_DEPARTMENT_OTHER)
Admission: RE | Admit: 2018-11-23 | Discharge: 2018-11-23 | Disposition: A | Discharge status: Home / Self Care | Payer: 59 | Source: Ambulatory Visit | Attending: Family | Admitting: Family

## 2018-11-23 ENCOUNTER — Other Ambulatory Visit: Payer: Self-pay

## 2018-11-23 DIAGNOSIS — E875 Hyperkalemia: Secondary | ICD-10-CM

## 2018-11-23 DIAGNOSIS — R9431 Abnormal electrocardiogram [ECG] [EKG]: Secondary | ICD-10-CM | POA: Diagnosis not present

## 2018-11-23 LAB — COMPREHENSIVE METABOLIC PANEL
ALT: 23 U/L (ref 0–35)
AST: 23 U/L (ref 0–37)
Albumin: 4.3 g/dL (ref 3.5–5.2)
Alkaline Phosphatase: 96 U/L (ref 39–117)
BUN: 10 mg/dL (ref 6–23)
CO2: 30 mEq/L (ref 19–32)
Calcium: 9.8 mg/dL (ref 8.4–10.5)
Chloride: 104 mEq/L (ref 96–112)
Creatinine, Ser: 0.88 mg/dL (ref 0.40–1.20)
GFR: 82.23 mL/min (ref >60.00)
Glucose, Bld: 91 mg/dL (ref 70–99)
Potassium: 5.7 mEq/L — ABNORMAL HIGH (ref 3.5–5.1)
Sodium: 141 mEq/L (ref 135–145)
Total Bilirubin: 0.8 mg/dL (ref 0.2–1.2)
Total Protein: 7.5 g/dL (ref 6.0–8.3)

## 2018-11-23 MED ORDER — PERFLUTREN LIPID MICROSPHERE
1.0000 mL | INTRAVENOUS | Status: AC | PRN
  Administered 2018-11-23: 3 mL via INTRAVENOUS
  Filled 2018-11-23: qty 10

## 2018-11-23 MED ORDER — SODIUM POLYSTYRENE SULFONATE 15 GM/60ML PO SUSP: 30.0000 g | Freq: Once | ORAL | 0 refills | Status: DC

## 2018-11-23 MED ORDER — SODIUM POLYSTYRENE SULFONATE 15 GM/60ML PO SUSP: 30.0000 g | Freq: Once | ORAL | 0 refills | Status: AC

## 2018-11-23 NOTE — Progress Notes (Signed)
  Echocardiogram 2D Echocardiogram has been performed.  Bruce Churilla T Keylen Uzelac 11/23/2018, 3:46 PM

## 2018-11-23 NOTE — Telephone Encounter (Signed)
Please contact patient and let her know that her liver function tests are improved, however her potassium remains quite elevated.  I would recommend that she take a dose of Kayexalate today and repeat her basic metabolic panel tomorrow.  I would also recommend that we refer her to see a kidney specialist to see if they can figure out why her potassium is running high.

## 2018-11-23 NOTE — Telephone Encounter (Signed)
Advised patient of results and to take medication today, she will be here tomorrow to repeat BMP. I left a list of potassium rich foods upfront for her to get tomorrow when she comes in to the lab.

## 2018-11-24 ENCOUNTER — Other Ambulatory Visit (INDEPENDENT_AMBULATORY_CARE_PROVIDER_SITE_OTHER): Payer: PRIVATE HEALTH INSURANCE

## 2018-11-24 ENCOUNTER — Telehealth: Payer: Self-pay | Admitting: Family

## 2018-11-24 DIAGNOSIS — E875 Hyperkalemia: Secondary | ICD-10-CM | POA: Diagnosis not present

## 2018-11-24 DIAGNOSIS — I340 Nonrheumatic mitral (valve) insufficiency: Secondary | ICD-10-CM

## 2018-11-24 LAB — BASIC METABOLIC PANEL
BUN: 14 mg/dL (ref 6–23)
CO2: 30 mEq/L (ref 19–32)
Calcium: 9.1 mg/dL (ref 8.4–10.5)
Chloride: 104 mEq/L (ref 96–112)
Creatinine, Ser: 0.88 mg/dL (ref 0.40–1.20)
GFR: 82.23 mL/min (ref >60.00)
Glucose, Bld: 82 mg/dL (ref 70–99)
Potassium: 3.7 mEq/L (ref 3.5–5.1)
Sodium: 142 mEq/L (ref 135–145)

## 2018-11-24 NOTE — Telephone Encounter (Signed)
Attempted to reach patient to discuss echo results.

## 2018-11-25 NOTE — Telephone Encounter (Signed)
Contacted patient reviewed echo results including LVH and moderate to severe mitral regurgitation.  Advised patient that I would like to refer her to cardiology for further evaluation.  We also discussed that her follow-up potassium is normal.  I advised her to continue a regular diet and to follow-up with me in 1 month.  We will plan to repeat her potassium at that time.  She denies shortness of breath or lower extremity edema.

## 2018-11-29 ENCOUNTER — Ambulatory Visit (HOSPITAL_BASED_OUTPATIENT_CLINIC_OR_DEPARTMENT_OTHER): Payer: PRIVATE HEALTH INSURANCE

## 2018-11-30 ENCOUNTER — Encounter: Payer: Self-pay | Admitting: Family

## 2018-12-01 ENCOUNTER — Ambulatory Visit (INDEPENDENT_AMBULATORY_CARE_PROVIDER_SITE_OTHER): Payer: PRIVATE HEALTH INSURANCE | Admitting: Cardiology

## 2018-12-01 ENCOUNTER — Encounter: Payer: Self-pay | Admitting: Cardiology

## 2018-12-01 VITALS — BP 122/70 | HR 64 | Ht 64.0 in | Wt 147.0 lb

## 2018-12-01 DIAGNOSIS — R9431 Abnormal electrocardiogram [ECG] [EKG]: Secondary | ICD-10-CM | POA: Diagnosis not present

## 2018-12-01 DIAGNOSIS — I77810 Thoracic aortic ectasia: Secondary | ICD-10-CM | POA: Diagnosis not present

## 2018-12-01 DIAGNOSIS — I340 Nonrheumatic mitral (valve) insufficiency: Secondary | ICD-10-CM | POA: Diagnosis not present

## 2018-12-01 NOTE — Progress Notes (Signed)
Cardiology Office Note:    Date:  12/02/2018   ID:  Jodi Hanson, DOB 10-07-1965, MRN [id number]  PCP:  Debbrah Alar, NP  Cardiologist:  No primary care provider on file.  Electrophysiologist:  None   Referring MD: Jenean Lindau, MD   Chief Complaint  Patient presents with  . Mitral Regurgitation   History of Present Illness:    Jodi Hanson is a 53 y.o. female with a history of hypertension, presenting for evaluation of mitral regurgitation.  The patient has been healthy and actually feels quite well.  She exercises regularly and denies any exertional symptoms of chest pain, chest pressure, or shortness of breath.  She has no orthopnea or PND.  She denies leg swelling.  She was noted to have an abnormal EKG and was referred for cardiac evaluation.  She just saw Dr. Geraldo Pitter yesterday after an echocardiogram demonstrated moderate to severe mitral regurgitation, low normal LV systolic function, frequent PVCs, severe concentric LVH, and restrictive filling pattern.  There was also a small pericardial effusion noted.  Because of her abnormal echo study demonstrating significant mitral regurgitation, she is referred here for further evaluation of potential treatment options.  The patient has no history of a heart murmur.  She never had rheumatic fever as a child.  She did take Fen/Phen in the 1990s.  She used to take antihypertensive medication, but greater than a decade ago she lost weight with diet and exercise and her blood pressure has been controlled without any medication.  She was on enalapril previously.  She reports typical blood pressure readings from about 115 to 130 mmHg over 80 to 85 mmHg.   Past Medical History:  Diagnosis Date  . Hypertension     Past Surgical History:  Procedure Laterality Date  . CESAREAN SECTION      Current Medications: Current Meds  Medication Sig  . 5-Hydroxytryptophan (5-HTP PO) Take by mouth daily.  Marland Kitchen aluminum chloride (DRYSOL)  20 % external solution APPLY SOLUTION TOPICALLY AT BEDTIME  . amoxicillin-clavulanate (AUGMENTIN) 875-125 MG tablet Take 1 tablet by mouth 2 (two) times daily.  . Melatonin 5 MG CAPS Take 1 capsule by mouth as needed.  . Multiple Vitamins-Minerals (MULTIVITAMIN WITH MINERALS) tablet Take 1 tablet by mouth daily.       Allergies:   Patient has no known allergies.   Social History   Socioeconomic History  . Marital status: Divorced    Spouse name: Not on file  . Number of children: Not on file  . Years of education: Not on file  . Highest education level: Not on file  Occupational History  . Not on file  Social Needs  . Financial resource strain: Not on file  . Food insecurity:    Worry: Not on file    Inability: Not on file  . Transportation needs:    Medical: Not on file    Non-medical: Not on file  Tobacco Use  . Smoking status: Never Smoker  . Smokeless tobacco: Never Used  Substance and Sexual Activity  . Alcohol use: Yes    Alcohol/week: 3.0 standard drinks    Types: 3 Glasses of wine per week  . Drug use: Not on file  . Sexual activity: Not on file  Lifestyle  . Physical activity:    Days per week: Not on file    Minutes per session: Not on file  . Stress: Not on file  Relationships  . Social connections:    Talks  on phone: Not on file    Gets together: Not on file    Attends religious service: Not on file    Active member of club or organization: Not on file    Attends meetings of clubs or organizations: Not on file    Relationship status: Not on file  Other Topics Concern  . 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- lives in [place]   Enjoys friends, food, running, walking her dog     Family History: The patient's family history includes Diabetes in her mother; HIV in her brother; Heart disease in her father; Hyperlipidemia in her mother; Hypertension in her brother, brother,  mother, and another family member; Stroke in her mother.  ROS:   Please see the history of present illness.    All other systems reviewed and are negative.  EKGs/Labs/Other Studies Reviewed:    The following studies were reviewed today:  EKG:  EKG is not ordered today.    Recent Labs: 11/14/2018: Hemoglobin 12.4; Platelets 403.0; TSH 0.86 11/23/2018: ALT 23 11/24/2018: BUN 14; Creatinine, Ser 0.88; Potassium 3.7; Sodium 142  Recent Lipid Panel    Component Value Date/Time   CHOL 191 11/14/2018 1346   TRIG 57.0 11/14/2018 1346   HDL 67.00 11/14/2018 1346   CHOLHDL 3 11/14/2018 1346   VLDL 11.4 11/14/2018 1346   LDLCALC 113 (H) 11/14/2018 1346    Physical Exam:    VS:  BP 138/82   Pulse 78   Ht 5\' 4"  (1.626 m)   Wt 147 lb 6.4 oz (66.9 kg)   SpO2 99%   BMI 25.30 kg/m     Wt Readings from Last 3 Encounters:  12/02/18 147 lb 6.4 oz (66.9 kg)  12/01/18 147 lb (66.7 kg)  11/14/18 145 lb (65.8 kg)     GEN:  Well nourished, well developed in no acute distress HEENT: Normal NECK: No JVD; No carotid bruits LYMPHATICS: No lymphadenopathy CARDIAC: RRR, there is a 3/6 midsystolic murmur at the left axilla with the patient in left lateral decubitus position RESPIRATORY:  Clear to auscultation without rales, wheezing or rhonchi  ABDOMEN: Soft, non-tender, non-distended MUSCULOSKELETAL:  No edema; No deformity  SKIN: Warm and dry NEUROLOGIC:  Alert and oriented x 3 PSYCHIATRIC:  Normal affect   ASSESSMENT:    1. Frequent PVCs   2. Moderate to severe mitral regurgitation   3. Ascending aorta dilatation (HCC)    PLAN:    In order of problems listed above:  1. The patient has frequent premature beats on my exam and had frequent PVCs during her echo study.  I have recommended a 24-hour Holter monitor to quantify PVC burden.  She may have a mild cardiomyopathy and I think it is important to determine her overall PVC burden.  She is asymptomatic with no palpitations. 2. I  personally reviewed her echo study.  She does appear to have low normal LV systolic function.  Her mitral valve appears mildly thickened, but I do not appreciate a prolapse or flail segment.  I think her mitral regurgitation is likely moderate and her disease stage is Stage C as she is asymptomatic.   The predominant mitral regurgitation jet is central and may be functional.  She has normal pulmonary vein flow with a systolic dominant pattern.  There is no evidence of pulmonary HTN but her LV function may be mildly reduced. While  the etiology of her mitral regurgitation is not entirely clear, I have recommended a transesophageal echo to better evaluate the functional anatomy of her mitral valve.  She does have a remote history of fenfluramine phentermine use and this is been associated with mitral valve dysfunction.  She would like to wait on this study as she is in between jobs and going through a transition with her insurance.  She requests to return in about 2 months and likely will want to proceed with a transesophageal echo at that point.  I have also recommended a cardiac MRI as this patient also has left ventricular hypertrophy, pericardial effusion, and valvular disease.  I think it is important to better quantify her LV systolic function and evaluate her for possible infiltrative disease.  She also requests to wait on the MRI.  I would recommend checking a cardiac MRI with and without contrast to evaluate the problems mentioned above as well as her ascending aortic dilatation. 3. We will assess with MRI/MRA in a few months as above.  May be related to history of hypertension.   Medication Adjustments/Labs and Tests Ordered: Current medicines are reviewed at length with the patient today.  Concerns regarding medicines are outlined above.  Orders Placed This Encounter  Procedures  . HOLTER MONITOR - 24 HOUR   No orders of the defined types were placed in this encounter.   Patient Instructions    Medication Instructions:  Your provider recommends that you continue on your current medications as directed. Please refer to the Current Medication list given to you today.    Labwork: None  Testing/Procedures: Your physician has recommended that you wear a holter monitor. Holter monitors are medical devices that record the heart's electrical activity. Doctors most often use these monitors to diagnose arrhythmias. Arrhythmias are problems with the speed or rhythm of the heartbeat. The monitor is a small, portable device. You can wear one while you do your normal daily activities. This is usually used to diagnose what is causing palpitations/syncope (passing out).  Follow-Up: You have an appointment with Dr. Antionette Char assistant, Richardson Dopp, on Friday, 02/10/2019 at 12:15PM.   Signed, Sherren Mocha, MD  12/02/2018 1:19 PM    [place]

## 2018-12-01 NOTE — Patient Instructions (Signed)
Medication Instructions:  Your physician recommends that you continue on your current medications as directed. Please refer to the Current Medication list given to you today.  If you need a refill on your cardiac medications before your next appointment, please call your pharmacy.   Lab work: None.  If you have labs (blood work) drawn today and your tests are completely normal, you will receive your results only by: Marland Kitchen MyChart Message (if you have MyChart) OR . A paper copy in the mail If you have any lab test that is abnormal or we need to change your treatment, we will call you to review the results.  Testing/Procedures: None.   Follow-Up: At [facility], you and your health needs are our priority.  As part of our continuing mission to provide you with exceptional heart care, we have created designated Provider Care Teams.  These Care Teams include your primary Cardiologist (physician) and Advanced Practice Providers (APPs -  Physician Assistants and Nurse Practitioners) who all work together to provide you with the care you need, when you need it. You will need a follow up appointment in 3 months.  Please call our office 2 months in advance to schedule this appointment.  You may see No primary care provider on file. or another member of our [employer] [facility] [place]: Jenne Campus, MD . Shirlee More, MD  Any Other Special Instructions Will Be Listed Below (If Applicable).  Dr. Geraldo Pitter has referred you to the structural heart clinic. Their office should call you within one week, if no please call out office.

## 2018-12-01 NOTE — Progress Notes (Signed)
Cardiology Office Note:    Date:  12/01/2018   ID:  Jodi Hanson, DOB 11-23-1965, MRN [id number]  PCP:  Debbrah Alar, NP  Cardiologist:  Jenean Lindau, MD   Referring MD: Debbrah Alar, NP    ASSESSMENT:    1. Moderate to severe mitral regurgitation   2. Abnormal electrocardiogram (ECG) (EKG)   3. Ascending aorta dilatation (HCC)    PLAN:    In order of problems listed above:  1. Results of the echocardiogram were discussed with the patient at extensive length.  I have very concerned about her mitral regurgitation and her ejection fraction.  In view of this I would refer her to our structural valve clinic.  I discussed the diagnosis and echocardiographic findings with her at extensive length.  I think she is a candidate for TEE and coronary angiography with possibly right and left heart catheterization to be considered for mitral valve repair.  But I will leave this decision to the experts at our structural heart clinic. 2. Patient will be seen in follow-up appointment in 6 months or earlier if the patient has any concerns.    Medication Adjustments/Labs and Tests Ordered: Current medicines are reviewed at length with the patient today.  Concerns regarding medicines are outlined above.  Orders Placed This Encounter  Procedures  . Ambulatory referral to Structural Heart/Valve Clinic (only at [place])   No orders of the defined types were placed in this encounter.    History of Present Illness:    Jodi Hanson is a 53 y.o. female who is being seen today for the evaluation of mitral valve regurgitation at the request of Debbrah Alar, NP.  Patient is a pleasant 53 year old female.  She has no significant past medical history.  She was found to have abnormal EKG and referred for the same reason.  Subsequently for this EKG she underwent echocardiography this revealed moderate to severe mitral regurgitation and ejection fraction which was estimated to be  50 to 55%.  Patient is an active lady.  She denies any chest pain orthopnea or PND.  At the time of my evaluation, the patient is alert awake oriented and in no distress.  Past Medical History:  Diagnosis Date  . Hypertension     Past Surgical History:  Procedure Laterality Date  . CESAREAN SECTION      Current Medications: Current Meds  Medication Sig  . aluminum chloride (DRYSOL) 20 % external solution APPLY SOLUTION TOPICALLY AT BEDTIME  . amoxicillin-clavulanate (AUGMENTIN) 875-125 MG tablet Take 1 tablet by mouth 2 (two) times daily.  . Multiple Vitamins-Minerals (MULTIVITAMIN WITH MINERALS) tablet Take 1 tablet by mouth daily.       Allergies:   Patient has no known allergies.   Social History   Socioeconomic History  . Marital status: Divorced    Spouse name: Not on file  . Number of children: Not on file  . Years of education: Not on file  . Highest education level: Not on file  Occupational History  . Not on file  Social Needs  . Financial resource strain: Not on file  . Food insecurity:    Worry: Not on file    Inability: Not on file  . Transportation needs:    Medical: Not on file    Non-medical: Not on file  Tobacco Use  . Smoking status: Never Smoker  . Smokeless tobacco: Never Used  Substance and Sexual Activity  . Alcohol use: Yes    Alcohol/week:  3.0 standard drinks    Types: 3 Glasses of wine per week  . Drug use: Not on file  . Sexual activity: Not on file  Lifestyle  . Physical activity:    Days per week: Not on file    Minutes per session: Not on file  . Stress: Not on file  Relationships  . Social connections:    Talks on phone: Not on file    Gets together: Not on file    Attends religious service: Not on file    Active member of club or organization: Not on file    Attends meetings of clubs or organizations: Not on file    Relationship status: Not on file  Other Topics Concern  . Not on file  Social History Narrative   Works as  an Lobbyist-  Works with EMR systems   One son born 1990- lives in [place]   Enjoys friends, food, running, walking her dog     Family History: The patient's family history includes Diabetes in her mother; HIV in her brother; Heart disease in her father; Hyperlipidemia in her mother; Hypertension in her brother, brother, mother, and another family member; Stroke in her mother.  ROS:   Please see the history of present illness.    All other systems reviewed and are negative.  EKGs/Labs/Other Studies Reviewed:    The following studies were reviewed today:  IMPRESSIONS    1. The left ventricle has low normal systolic function, with an ejection fraction of 50-55%.  2. Frequent PVC's are present.The cavity size was normal. There is severe concentric left ventricular hypertrophy. Left ventricular diastolic Doppler parameters are consistent with restrictive filling Elevated left atrial and left ventricular  end-diastolic pressures.  3. The right ventricle has normal systolic function. The cavity was normal. There is no increase in right ventricular wall thickness.  4. Left atrial size was mildly dilated.  5. Right atrial size was moderately dilated.  6. Small pericardial effusion.  7. The pericardial effusion is posterior to the left ventricle.  8. The mitral valve is normal in structure. Mitral valve regurgitation is functional and moderate to severe by color flow Doppler. The MR jet is eccentric laterally directed.  9. The tricuspid valve is normal in structure. Tricuspid valve regurgitation is mild-moderate. 10. There is moderate dilatation of the ascending aorta measuring 43 mm.    Recent Labs: 11/14/2018: Hemoglobin 12.4; Platelets 403.0; TSH 0.86 11/23/2018: ALT 23 11/24/2018: BUN 14; Creatinine, Ser 0.88; Potassium 3.7; Sodium 142  Recent Lipid Panel    Component Value Date/Time   CHOL 191 11/14/2018 1346   TRIG 57.0 11/14/2018 1346   HDL 67.00 11/14/2018 1346    CHOLHDL 3 11/14/2018 1346   VLDL 11.4 11/14/2018 1346   LDLCALC 113 (H) 11/14/2018 1346    Physical Exam:    VS:  BP 122/70 (BP Location: Right Arm, Patient Position: Sitting, Cuff Size: Normal)   Pulse 64   Ht 5\' 4"  (1.626 m)   Wt 147 lb (66.7 kg)   SpO2 100%   BMI 25.23 kg/m     Wt Readings from Last 3 Encounters:  12/01/18 147 lb (66.7 kg)  11/14/18 145 lb (65.8 kg)  01/08/17 151 lb 9.6 oz (68.8 kg)     GEN: Patient is in no acute distress HEENT: Normal NECK: No JVD; No carotid bruits LYMPHATICS: No lymphadenopathy CARDIAC: S1 S2 regular, 2/6 systolic murmur at the apex. RESPIRATORY:  Clear to auscultation without rales, wheezing or  rhonchi  ABDOMEN: Soft, non-tender, non-distended MUSCULOSKELETAL:  No edema; No deformity  SKIN: Warm and dry NEUROLOGIC:  Alert and oriented x 3 PSYCHIATRIC:  Normal affect    Signed, Jenean Lindau, MD  12/01/2018 9:41 AM    [place]

## 2018-12-02 ENCOUNTER — Encounter: Payer: Self-pay | Admitting: Cardiovascular Disease

## 2018-12-02 ENCOUNTER — Ambulatory Visit (INDEPENDENT_AMBULATORY_CARE_PROVIDER_SITE_OTHER): Payer: PRIVATE HEALTH INSURANCE | Admitting: Cardiovascular Disease

## 2018-12-02 VITALS — BP 138/82 | HR 78 | Ht 64.0 in | Wt 147.4 lb

## 2018-12-02 DIAGNOSIS — I493 Ventricular premature depolarization: Secondary | ICD-10-CM

## 2018-12-02 DIAGNOSIS — I77810 Thoracic aortic ectasia: Secondary | ICD-10-CM | POA: Diagnosis not present

## 2018-12-02 DIAGNOSIS — I340 Nonrheumatic mitral (valve) insufficiency: Secondary | ICD-10-CM | POA: Diagnosis not present

## 2018-12-02 NOTE — Patient Instructions (Addendum)
Medication Instructions:  Your provider recommends that you continue on your current medications as directed. Please refer to the Current Medication list given to you today.    Labwork: None  Testing/Procedures: Your physician has recommended that you wear a holter monitor. Holter monitors are medical devices that record the heart's electrical activity. Doctors most often use these monitors to diagnose arrhythmias. Arrhythmias are problems with the speed or rhythm of the heartbeat. The monitor is a small, portable device. You can wear one while you do your normal daily activities. This is usually used to diagnose what is causing palpitations/syncope (passing out).  Follow-Up: You have an appointment with Dr. Antionette Char assistant, Richardson Dopp, on Friday, 02/10/2019 at 12:15PM.

## 2018-12-13 ENCOUNTER — Encounter: Payer: Self-pay | Admitting: Family

## 2019-02-02 ENCOUNTER — Encounter: Payer: Self-pay | Admitting: Family

## 2019-02-10 ENCOUNTER — Ambulatory Visit: Payer: PRIVATE HEALTH INSURANCE | Admitting: Physician Assistant

## 2019-03-03 ENCOUNTER — Ambulatory Visit: Payer: PRIVATE HEALTH INSURANCE | Admitting: Cardiology

## 2019-11-14 ENCOUNTER — Encounter: Payer: Managed Care, Other (non HMO) | Admitting: Family

## 2019-11-20 ENCOUNTER — Ambulatory Visit: Payer: Managed Care, Other (non HMO) | Attending: Family

## 2019-11-20 DIAGNOSIS — Z23 Encounter for immunization: Secondary | ICD-10-CM | POA: Insufficient documentation

## 2019-11-20 NOTE — Progress Notes (Signed)
   Covid-19 Vaccination Clinic  Name:  Jodi Hanson    MRN: [id number] DOB: July 01, 1966  11/20/2019  [name] was observed post Covid-19 immunization for 15 minutes without incidence. She was provided with Vaccine Information Sheet and instruction to access the V-Safe system.   [name] was instructed to call 911 with any severe reactions post vaccine: Marland Kitchen Difficulty breathing  . Swelling of your face and throat  . A fast heartbeat  . A bad rash all over your body  . Dizziness and weakness    Immunizations Administered    Name Date Dose VIS Date Route   Moderna COVID-19 Vaccine 11/20/2019  2:01 PM 0.5 mL 08/29/2019 Intramuscular   Manufacturer: Moderna   Lot: [id number]   [id number]

## 2019-12-22 ENCOUNTER — Other Ambulatory Visit: Payer: Self-pay

## 2019-12-22 ENCOUNTER — Ambulatory Visit (INDEPENDENT_AMBULATORY_CARE_PROVIDER_SITE_OTHER): Payer: 59 | Admitting: Family

## 2019-12-22 ENCOUNTER — Telehealth: Payer: Self-pay | Admitting: *Deleted

## 2019-12-22 ENCOUNTER — Encounter: Payer: Self-pay | Admitting: Family

## 2019-12-22 VITALS — BP 138/90 | HR 67 | Temp 96.1°F | Resp 16 | Ht 64.0 in | Wt 135.0 lb

## 2019-12-22 DIAGNOSIS — Z0000 Encounter for general adult medical examination without abnormal findings: Secondary | ICD-10-CM

## 2019-12-22 LAB — CBC WITH DIFFERENTIAL/PLATELET
Basophils Absolute: 0.1 10*3/uL (ref 0.0–0.1)
Basophils Relative: 1.3 % (ref 0.0–3.0)
Eosinophils Absolute: 0.1 10*3/uL (ref 0.0–0.7)
Eosinophils Relative: 2.8 % (ref 0.0–5.0)
HCT: 37.6 % (ref 36.0–46.0)
Hemoglobin: 12.1 g/dL (ref 12.0–15.0)
Lymphocytes Relative: 45 % (ref 12.0–46.0)
Lymphs Abs: 1.9 10*3/uL (ref 0.7–4.0)
MCHC: 32.1 g/dL (ref 30.0–36.0)
MCV: 79.2 fl (ref 78.0–100.0)
Monocytes Absolute: 0.4 10*3/uL (ref 0.1–1.0)
Monocytes Relative: 9.9 % (ref 3.0–12.0)
Neutro Abs: 1.7 10*3/uL (ref 1.4–7.7)
Neutrophils Relative %: 41 % — ABNORMAL LOW (ref 43.0–77.0)
Platelets: 316 10*3/uL (ref 150.0–400.0)
RBC: 4.74 Mil/uL (ref 3.87–5.11)
RDW: 15.5 % (ref 11.5–15.5)
WBC: 4.3 10*3/uL (ref 4.0–10.5)

## 2019-12-22 LAB — BASIC METABOLIC PANEL
BUN: 24 mg/dL — ABNORMAL HIGH (ref 6–23)
CO2: 27 mEq/L (ref 19–32)
Calcium: 9.2 mg/dL (ref 8.4–10.5)
Chloride: 105 mEq/L (ref 96–112)
Creatinine, Ser: 1.34 mg/dL — ABNORMAL HIGH (ref 0.40–1.20)
GFR: 50.4 mL/min — ABNORMAL LOW (ref >60.00)
Glucose, Bld: 86 mg/dL (ref 70–99)
Potassium: 4.3 mEq/L (ref 3.5–5.1)
Sodium: 143 mEq/L (ref 135–145)

## 2019-12-22 LAB — TSH: TSH: 0.69 u[IU]/mL (ref 0.35–4.50)

## 2019-12-22 LAB — LIPID PANEL
Cholesterol: 212 mg/dL — ABNORMAL HIGH (ref 0–200)
HDL: 63.3 mg/dL (ref >39.00)
LDL Cholesterol: 137 mg/dL — ABNORMAL HIGH (ref 0–99)
NonHDL: 148.95
Total CHOL/HDL Ratio: 3
Triglycerides: 59 mg/dL (ref 0.0–149.0)
VLDL: 11.8 mg/dL (ref 0.0–40.0)

## 2019-12-22 LAB — HEPATIC FUNCTION PANEL
ALT: 17 U/L (ref 0–35)
AST: 23 U/L (ref 0–37)
Albumin: 4.2 g/dL (ref 3.5–5.2)
Alkaline Phosphatase: 80 U/L (ref 39–117)
Bilirubin, Direct: 0.2 mg/dL (ref 0.0–0.3)
Total Bilirubin: 1.2 mg/dL (ref 0.2–1.2)
Total Protein: 6.8 g/dL (ref 6.0–8.3)

## 2019-12-22 NOTE — Telephone Encounter (Signed)
Caller Name Bellaluna Roelle Caller Phone Number [phone] Patient Name Jodi Hanson Patient DOB 1966/02/28 Call Type Message Only Information Provided Reason for Call Request for General Office Information Initial Comment Caller states they want to have a call back from the nurse. She states she had an appointment this morning at 7 and her medication list is not updated in her [facility]. Caller would like a call back from [facility].

## 2019-12-22 NOTE — Progress Notes (Addendum)
Subjective:    Patient ID: Jodi Hanson, female    DOB: [birth year], 54 y.o.   MRN: [id number]  HPI  Patient presents today for complete physical.  Immunizations: will get second moderna Diet: healthy Exercise: runs, strength, pilates Colonoscopy: due Pap Smear: 11/18/17 Mammogram: due Vision: 2 months ago Dental: due Wt Readings from Last 3 Encounters:  12/22/19 135 lb (61.2 kg)  12/02/18 147 lb 6.4 oz (66.9 kg)  12/01/18 147 lb (66.7 kg)   BP Readings from Last 3 Encounters:  12/22/19 138/90  12/02/18 138/82  12/01/18 122/70       Review of Systems  Constitutional: Negative for unexpected weight change.  HENT: Negative for hearing loss and rhinorrhea.   Eyes: Negative for visual disturbance.  Respiratory: Negative for cough and shortness of breath.   Cardiovascular: Negative for chest pain and leg swelling.  Gastrointestinal: Negative for blood in stool, constipation and diarrhea.  Genitourinary: Negative for dysuria, frequency and hematuria.  Musculoskeletal: Negative for arthralgias and myalgias.  Skin: Negative for rash.  Neurological: Negative for headaches.  Hematological: Negative for adenopathy.  Psychiatric/Behavioral:       Denies depression/anxiety       Past Medical History:  Diagnosis Date  . Hypertension      Social History   Socioeconomic History  . Marital status: Divorced    Spouse name: Not on file  . Number of children: Not on file  . Years of education: Not on file  . Highest education level: Not on file  Occupational History  . Not on file  Tobacco Use  . Smoking status: Never Smoker  . Smokeless tobacco: Never Used  Substance and Sexual Activity  . Alcohol use: Yes    Alcohol/week: 3.0 standard drinks    Types: 3 Glasses of wine per week  . Drug use: Not on file  . Sexual activity: Not on file  Other Topics Concern  . 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 Difficulty of Paying Living Expenses:   Food Insecurity:   . Worried About Charity fundraiser in the Last Year:   . Arboriculturist in the Last Year:   Transportation Needs:   . Film/video editor (Medical):   Marland Kitchen Lack of Transportation (Non-Medical):   Physical Activity:   . Days of Exercise per Week:   . Minutes of Exercise per Session:   Stress:   . Feeling of Stress :   Social Connections:   . Frequency of Communication with Friends and Family:   . Frequency of Social Gatherings with Friends and Family:   . Attends Religious Services:   . Active Member of Clubs or Organizations:   . Attends Archivist Meetings:   Marland Kitchen Marital Status:   Intimate Partner Violence:   . Fear of Current or Ex-Partner:   . Emotionally Abused:   Marland Kitchen Physically Abused:   . Sexually Abused:     Past Surgical History:  Procedure Laterality Date  . CESAREAN SECTION    . HERNIA REPAIR  2005    Family History  Problem Relation Age of Onset  . Diabetes Mother   . Hyperlipidemia Mother   . Hypertension Mother   . Stroke Mother   .  Heart disease Father   . Hypertension Brother   . Hypertension Brother   . HIV Brother   . Hypertension Other     No Known Allergies  Current Outpatient Medications on File Prior to Visit  Medication Sig Dispense Refill  . Ascorbic Acid (VITAMIN C WITH ROSE HIPS) 500 MG tablet Take 500 mg by mouth daily.    . Ascorbic Acid (VITAMIN C) 1000 MG tablet Take 1,000 mg by mouth daily.    Marland Kitchen aspirin EC 81 MG tablet Take 81 mg by mouth daily.    . Calcium Carbonate-Vit D-Min (CALCIUM 1200 PO) Take by mouth.    . Melatonin 5 MG CAPS Take by mouth.    . Multiple Vitamins-Minerals (MULTIVITAMIN WITH MINERALS) tablet Take 1 tablet by mouth daily.       No current facility-administered medications on file prior to  visit.    BP 138/90   Pulse 67   Temp (!) 96.1 F (35.6 C) (Temporal)   Resp 16   Ht 5\' 4"  (1.626 m)   Wt 135 lb (61.2 kg)   SpO2 100%   BMI 23.17 kg/m    Objective:   Physical Exam Physical Exam  Constitutional: She is oriented to person, place, and time. She appears well-developed and well-nourished. No distress.  HENT:  Head: Normocephalic and atraumatic.  Right Ear: Tympanic membrane and ear canal normal.  Left Ear: Tympanic membrane and ear canal normal.  Mouth/Throat: not examined- pt wearing mask Eyes: Pupils are equal, round, and reactive to light. No scleral icterus.  Neck: Normal range of motion. No thyromegaly present.  Cardiovascular: Normal rate and regular rhythm.   No murmur heard. Pulmonary/Chest: Effort normal and breath sounds normal. No respiratory distress. He has no wheezes. She has no rales. She exhibits no tenderness.  Abdominal: Soft. Bowel sounds are normal. She exhibits no distension.  There is a non-tender ventral hernia noted LUQ (she states that she has had previous repair for this hernia). There is no tenderness. There is no rebound and no guarding.  Musculoskeletal: She exhibits no edema.  Lymphadenopathy:    She has no cervical adenopathy.  Neurological: She is alert and oriented to person, place, and time. She has normal patellar reflexes. She exhibits normal muscle tone. Coordination normal.  Skin: Skin is warm and dry.  Psychiatric: She has a normal mood and affect. Her behavior is normal. Judgment and thought content normal.  Breast/pelvic: deferred        Assessment & Plan:  Preventative care- she has lost weight this year (purposely). Encouraged her to continue healthy diet and regular exercise. Will obtain routine lab work.  She is scheduled to receive her second Moderna vaccine, therefore will defer Shingrix and Tetanus shots today.  Refer for mammogram and colonoscopy.    This visit occurred during the SARS-CoV-2 public health  emergency.  Safety protocols were in place, including screening questions prior to the visit, additional usage of staff PPE, and extensive cleaning of exam room while observing appropriate contact time as indicated for disinfecting solutions.        Assessment & Plan:

## 2019-12-22 NOTE — Patient Instructions (Addendum)
Please schedule a routine dental appointment.  Continue healthy diet and regular exercise.

## 2019-12-22 NOTE — Telephone Encounter (Signed)
List updated, patient  advised.

## 2019-12-24 ENCOUNTER — Telehealth: Payer: Self-pay | Admitting: Family

## 2019-12-24 DIAGNOSIS — K439 Ventral hernia without obstruction or gangrene: Secondary | ICD-10-CM

## 2019-12-26 ENCOUNTER — Ambulatory Visit: Payer: 59 | Attending: Family

## 2019-12-26 DIAGNOSIS — Z23 Encounter for immunization: Secondary | ICD-10-CM

## 2019-12-26 NOTE — Progress Notes (Signed)
   Covid-19 Vaccination Clinic  Name:  Jodi Hanson    MRN: [id number] DOB: [birth year]  12/26/2019  [name] was observed post Covid-19 immunization for 15 minutes without incident. She was provided with Vaccine Information Sheet and instruction to access the V-Safe system.   [name] was instructed to call 911 with any severe reactions post vaccine: Marland Kitchen Difficulty breathing  . Swelling of face and throat  . A fast heartbeat  . A bad rash all over body  . Dizziness and weakness   Immunizations Administered    Name Date Dose VIS Date Route   Moderna COVID-19 Vaccine 12/26/2019  1:59 PM 0.5 mL 08/29/2019 Intramuscular   Manufacturer: Moderna   Lot: [id number]   [id number]

## 2019-12-29 NOTE — Telephone Encounter (Signed)
Please see unread mychart message. Can you please contact pt to communicate message?

## 2020-01-01 NOTE — Telephone Encounter (Signed)
Called patient and reviewed this message with her in detail. She said she is "not ready for a ct scan at this time and she will read the message again later at home and let us know if she will like to precede with CT". She reported she is taking anti-inflammatories, she was advised to dc and to continue to monitor bp.

## 2020-02-22 ENCOUNTER — Encounter: Payer: Self-pay | Admitting: Family

## 2020-02-29 ENCOUNTER — Encounter: Payer: Self-pay | Admitting: Family

## 2020-03-01 ENCOUNTER — Encounter: Payer: Self-pay | Admitting: Family

## 2020-03-01 ENCOUNTER — Ambulatory Visit: Payer: 59 | Admitting: Family

## 2020-03-01 ENCOUNTER — Other Ambulatory Visit: Payer: Self-pay

## 2020-03-01 ENCOUNTER — Ambulatory Visit (HOSPITAL_BASED_OUTPATIENT_CLINIC_OR_DEPARTMENT_OTHER)
Admission: RE | Admit: 2020-03-01 | Discharge: 2020-03-01 | Disposition: A | Discharge status: Home / Self Care | Payer: 59 | Source: Ambulatory Visit | Attending: Family | Admitting: Family

## 2020-03-01 VITALS — BP 147/78 | HR 58 | Temp 97.8°F | Resp 16 | Ht 64.0 in | Wt 135.2 lb

## 2020-03-01 DIAGNOSIS — Z0000 Encounter for general adult medical examination without abnormal findings: Secondary | ICD-10-CM

## 2020-03-01 DIAGNOSIS — I10 Essential (primary) hypertension: Secondary | ICD-10-CM

## 2020-03-01 DIAGNOSIS — Z1231 Encounter for screening mammogram for malignant neoplasm of breast: Secondary | ICD-10-CM | POA: Insufficient documentation

## 2020-03-01 DIAGNOSIS — N289 Disorder of kidney and ureter, unspecified: Secondary | ICD-10-CM

## 2020-03-01 LAB — BASIC METABOLIC PANEL
BUN: 10 mg/dL (ref 6–23)
CO2: 30 mEq/L (ref 19–32)
Calcium: 9.4 mg/dL (ref 8.4–10.5)
Chloride: 103 mEq/L (ref 96–112)
Creatinine, Ser: 0.82 mg/dL (ref 0.40–1.20)
GFR: 88.76 mL/min (ref >60.00)
Glucose, Bld: 99 mg/dL (ref 70–99)
Potassium: 4.1 mEq/L (ref 3.5–5.1)
Sodium: 140 mEq/L (ref 135–145)

## 2020-03-01 MED ORDER — AMLODIPINE BESYLATE 2.5 MG PO TABS: 2.5000 mg | ORAL_TABLET | Freq: Every day | ORAL | 1 refills | Status: DC

## 2020-03-01 NOTE — Progress Notes (Signed)
Subjective:    Patient ID: Jodi Hanson, female    DOB: Dec 19, 1965, 54 y.o.   MRN: [id number]  HPI   Patient is a 54 yr old female who presents today for follow up.   She has not yet scheduled her colonoscopy but plans to schedule- she is working on getting a ride arranged.   HTN- She is not currently on antihypertensives.  BP Readings from Last 3 Encounters:  03/01/20 (!) 147/78  12/22/19 138/90  12/02/18 138/82   Home bp readings as below:  Date Time BP Pulse DIA SYS 02/29/20 10:29:00 100/81 73 100 81 02/29/20 10:28:00 104/86 83 104 86 03/03/20 8:12:00 148/92 57 148 92 02/26/20 9:16:00 133/87 55 133 87 02/26/20 9:15:00 151/108 61 151 108 02/24/20 12:40:00 120/94 67 120 94 02/21/20 22:24:00 143/91 54 143 91 02/19/20 9:36:00 132/90 66 132 90 02/18/20 10:03:00 132/85 62 132 85 02/17/20 12:12:00 140/91 58 140 91 02/14/20 8:39:00 136/104 63 136 104 02/13/20 23:35:00 145/99 63 145 99 02/13/20 22:33:00 153/103 68 153 103 63.85 133.62 93.15 Averages  Wt Readings from Last 3 Encounters:  03/01/20 135 lb 3.2 oz (61.3 kg)  12/22/19 135 lb (61.2 kg)  12/02/18 147 lb 6.4 oz (66.9 kg)     Review of Systems See HPI  Past Medical History:  Diagnosis Date  . Hypertension      Social History   Socioeconomic History  . Marital status: Divorced    Spouse name: Not on file  . Number of children: Not on file  . Years of education: Not on file  . Highest education level: Not on file  Occupational History  . Not on file  Tobacco Use  . Smoking status: Never Smoker  . Smokeless tobacco: Never Used  Substance and Sexual Activity  . Alcohol use: Yes    Alcohol/week: 3.0 standard drinks    Types: 3 Glasses of wine per week  . Drug use: Not on file  . Sexual activity: Not on file  Other Topics Concern  . 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 Difficulty of Paying Living Expenses:   Food Insecurity:   . Worried About Charity fundraiser in the Last Year:   . Arboriculturist in the Last Year:   Transportation Needs:   . Film/video editor (Medical):   Marland Kitchen Lack of Transportation (Non-Medical):   Physical Activity:   . Days of Exercise per Week:   . Minutes of Exercise per Session:   Stress:   . Feeling of Stress :   Social Connections:   . Frequency of Communication with Friends and Family:   . Frequency of Social Gatherings with Friends and Family:   . Attends Religious Services:   . Active Member of Clubs or Organizations:   . Attends Archivist Meetings:   Marland Kitchen Marital Status:   Intimate Partner Violence:   . Fear of Current or Ex-Partner:   . Emotionally Abused:   Marland Kitchen Physically Abused:   . Sexually Abused:     Past Surgical History:  Procedure Laterality Date  . CESAREAN SECTION    . HERNIA REPAIR  2005    Family History  Problem Relation Age of Onset  . Diabetes  Mother   . Hyperlipidemia Mother   . Hypertension Mother   . Stroke Mother   . Heart disease Father   . Hypertension Brother   . Hypertension Brother   . HIV Brother   . Hypertension Other     No Known Allergies  Current Outpatient Medications on File Prior to Visit  Medication Sig Dispense Refill  . Ascorbic Acid (VITAMIN C WITH ROSE HIPS) 500 MG tablet Take 500 mg by mouth daily.    Marland Kitchen aspirin EC 81 MG tablet Take 81 mg by mouth daily.    . Calcium Carbonate-Vit D-Min (CALCIUM 1200 PO) Take by mouth.    . Melatonin 5 MG CAPS Take by mouth.    . Multiple Vitamins-Minerals (MULTIVITAMIN WITH MINERALS) tablet Take 1 tablet by mouth daily.       No current facility-administered medications on file prior to visit.    BP (!) 147/78 (BP Location: Right Arm, Patient Position: Sitting, Cuff Size: Small)   Pulse (!) 58   Temp 97.8 F (36.6 C) (Temporal)   Resp  16   Ht 5\' 4"  (1.626 m)   Wt 135 lb 3.2 oz (61.3 kg)   BMI 23.21 kg/m       Objective:   Physical Exam Constitutional:      Appearance: She is well-developed.  Neck:     Thyroid: No thyromegaly.  Cardiovascular:     Rate and Rhythm: Normal rate and regular rhythm.     Heart sounds: Normal heart sounds. No murmur.  Pulmonary:     Effort: Pulmonary effort is normal. No respiratory distress.     Breath sounds: Normal breath sounds. No wheezing.  Musculoskeletal:     Cervical back: Neck supple.  Skin:    General: Skin is warm and dry.  Neurological:     Mental Status: She is alert and oriented to person, place, and time.  Psychiatric:        Behavior: Behavior normal.        Thought Content: Thought content normal.        Judgment: Judgment normal.           Assessment & Plan:  Renal insufficiency- noted last visit. Repeat bmet.   HTN-uncontrolled.  has started having some elevated readings at home. She has been treated in the past with medication. Will initiate amlodipine 2.5mg  once daily. She will continue to monitor her bp at home and will let me know if she has any concerns. Follow up in 1 month.

## 2020-03-01 NOTE — Patient Instructions (Addendum)
Start amlodipine 2.5mg  once daily.  Complete lab work prior to leaving.

## 2020-03-04 ENCOUNTER — Encounter: Payer: Self-pay | Admitting: Family

## 2020-03-05 ENCOUNTER — Telehealth: Payer: Self-pay

## 2020-03-05 NOTE — Telephone Encounter (Signed)
PA initiated via Covermymeds; KEY: [id number]. Awaiting determination.

## 2020-03-13 NOTE — Telephone Encounter (Signed)
PA cancelled by plan. PA is not required for this medication at this time.

## 2020-03-29 ENCOUNTER — Ambulatory Visit: Payer: 59 | Admitting: Family

## 2020-03-29 ENCOUNTER — Other Ambulatory Visit: Payer: Self-pay

## 2020-03-29 VITALS — BP 122/78 | HR 69 | Resp 16 | Ht 64.0 in | Wt 136.0 lb

## 2020-03-29 DIAGNOSIS — I10 Essential (primary) hypertension: Secondary | ICD-10-CM | POA: Diagnosis not present

## 2020-03-29 DIAGNOSIS — I340 Nonrheumatic mitral (valve) insufficiency: Secondary | ICD-10-CM

## 2020-03-29 LAB — BASIC METABOLIC PANEL
BUN: 14 mg/dL (ref 6–23)
CO2: 29 mEq/L (ref 19–32)
Calcium: 9.3 mg/dL (ref 8.4–10.5)
Chloride: 104 mEq/L (ref 96–112)
Creatinine, Ser: 0.89 mg/dL (ref 0.40–1.20)
GFR: 80.73 mL/min (ref >60.00)
Glucose, Bld: 95 mg/dL (ref 70–99)
Potassium: 4 mEq/L (ref 3.5–5.1)
Sodium: 140 mEq/L (ref 135–145)

## 2020-03-29 NOTE — Patient Instructions (Addendum)
Please schedule a follow up appointment with Dr. Burt Knack.  Complete lab work prior to leaving.

## 2020-03-29 NOTE — Progress Notes (Signed)
Subjective:    Patient ID: Jodi Hanson, female    DOB: 1966-05-22, 54 y.o.   MRN: [id number]  HPI  Patient is a 54 yr old female who presents today for follow up.  HTN- pt is maintained on amlodipine 2.5mg . She reports that her home readings have been very good and similar to today's reading.   Mitral Regurgitation- moderate-severe per echo 2/20. A transesphageal echo was recommended by cardiology at that time. This was not performed. Denies palpitations, sob or chest pain.    Review of Systems   See HPI  Past Medical History:  Diagnosis Date  . Hypertension      Social History   Socioeconomic History  . Marital status: Divorced    Spouse name: Not on file  . Number of children: Not on file  . Years of education: Not on file  . Highest education level: Not on file  Occupational History  . Not on file  Tobacco Use  . Smoking status: Never Smoker  . Smokeless tobacco: Never Used  Substance and Sexual Activity  . Alcohol use: Yes    Alcohol/week: 3.0 standard drinks    Types: 3 Glasses of wine per week  . Drug use: Not on file  . Sexual activity: Not on file  Other Topics Concern  . 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 Difficulty of Paying Living Expenses:   Food Insecurity:   . Worried About Charity fundraiser in the Last Year:   . Arboriculturist in the Last Year:   Transportation Needs:   . Film/video editor (Medical):   Marland Kitchen Lack of Transportation (Non-Medical):   Physical Activity:   . Days of Exercise per Week:   . Minutes of Exercise per Session:   Stress:   . Feeling of Stress :   Social Connections:   . Frequency of Communication with Friends and Family:   . Frequency of Social Gatherings with Friends and Family:   . Attends  Religious Services:   . Active Member of Clubs or Organizations:   . Attends Archivist Meetings:   Marland Kitchen Marital Status:   Intimate Partner Violence:   . Fear of Current or Ex-Partner:   . Emotionally Abused:   Marland Kitchen Physically Abused:   . Sexually Abused:     Past Surgical History:  Procedure Laterality Date  . CESAREAN SECTION    . HERNIA REPAIR  2005    Family History  Problem Relation Age of Onset  . Diabetes Mother   . Hyperlipidemia Mother   . Hypertension Mother   . Stroke Mother   . Heart disease Father   . Hypertension Brother   . Hypertension Brother   . HIV Brother   . Hypertension Other     No Known Allergies  Current Outpatient Medications on File Prior to Visit  Medication Sig Dispense Refill  . amLODipine (NORVASC) 2.5 MG tablet Take 1 tablet (2.5 mg total) by mouth daily. 90 tablet 1  . Ascorbic Acid (VITAMIN C WITH ROSE HIPS) 500 MG tablet Take 500 mg by mouth daily.    Marland Kitchen aspirin EC 81 MG tablet Take 81 mg by mouth daily.    . Calcium Carbonate-Vit  D-Min (CALCIUM 1200 PO) Take by mouth.    . Melatonin 5 MG CAPS Take by mouth.    . Multiple Vitamins-Minerals (MULTIVITAMIN WITH MINERALS) tablet Take 1 tablet by mouth daily.       No current facility-administered medications on file prior to visit.    BP 122/78 (BP Location: Right Arm, Patient Position: Sitting, Cuff Size: Normal)   Pulse 69   Resp 16   Ht 5\' 4"  (1.626 m)   Wt 136 lb (61.7 kg)   SpO2 98%   BMI 23.34 kg/m        Objective:   Physical Exam Constitutional:      Appearance: She is well-developed.  Cardiovascular:     Rate and Rhythm: Normal rate and regular rhythm.     Heart sounds: Murmur (grade 1 systolic murmur) heard.   Pulmonary:     Effort: Pulmonary effort is normal. No respiratory distress.     Breath sounds: Normal breath sounds. No wheezing.  Psychiatric:        Behavior: Behavior normal.        Thought Content: Thought content normal.        Judgment:  Judgment normal.           Assessment & Plan:  Mitral regurgitation- I advised pt to schedule follow up with cardiology. She is clinically asymptomatic at this time.  HTN- bp stable and at goal. Continue current dose of amlodipine.  Obtain follow up bmet.  This visit occurred during the SARS-CoV-2 public health emergency.  Safety protocols were in place, including screening questions prior to the visit, additional usage of staff PPE, and extensive cleaning of exam room while observing appropriate contact time as indicated for disinfecting solutions.

## 2020-04-08 ENCOUNTER — Encounter: Payer: Self-pay | Admitting: Gastroenterology

## 2020-04-16 ENCOUNTER — Encounter: Payer: Self-pay | Admitting: Gastroenterology

## 2020-05-24 ENCOUNTER — Other Ambulatory Visit: Payer: 59

## 2020-05-24 ENCOUNTER — Other Ambulatory Visit: Payer: Self-pay | Admitting: Critical Care Medicine

## 2020-05-24 DIAGNOSIS — Z20822 Contact with and (suspected) exposure to covid-19: Secondary | ICD-10-CM

## 2020-05-26 LAB — NOVEL CORONAVIRUS, NAA: SARS-CoV-2, NAA: NOT DETECTED

## 2020-05-26 LAB — SARS-COV-2, NAA 2 DAY TAT

## 2020-06-14 ENCOUNTER — Other Ambulatory Visit: Payer: Self-pay

## 2020-06-14 ENCOUNTER — Ambulatory Visit (AMBULATORY_SURGERY_CENTER): Payer: Self-pay | Admitting: *Deleted

## 2020-06-14 ENCOUNTER — Encounter: Payer: Self-pay | Admitting: Gastroenterology

## 2020-06-14 VITALS — Ht 64.0 in | Wt 140.0 lb

## 2020-06-14 DIAGNOSIS — Z1211 Encounter for screening for malignant neoplasm of colon: Secondary | ICD-10-CM

## 2020-06-14 MED ORDER — SUTAB 1479-225-188 MG PO TABS: 24.0000 | ORAL_TABLET | ORAL | 0 refills | Status: DC

## 2020-06-14 NOTE — Progress Notes (Signed)

## 2020-06-28 ENCOUNTER — Encounter: Payer: Self-pay | Admitting: Gastroenterology

## 2020-06-28 ENCOUNTER — Other Ambulatory Visit: Payer: Self-pay

## 2020-06-28 ENCOUNTER — Ambulatory Visit (AMBULATORY_SURGERY_CENTER): Payer: 59 | Admitting: Gastroenterology

## 2020-06-28 VITALS — BP 160/73 | HR 60 | Temp 98.2°F | Resp 12 | Ht 64.0 in | Wt 140.0 lb

## 2020-06-28 DIAGNOSIS — K635 Polyp of colon: Secondary | ICD-10-CM

## 2020-06-28 DIAGNOSIS — K6389 Other specified diseases of intestine: Secondary | ICD-10-CM

## 2020-06-28 DIAGNOSIS — Z1211 Encounter for screening for malignant neoplasm of colon: Secondary | ICD-10-CM | POA: Diagnosis not present

## 2020-06-28 DIAGNOSIS — D122 Benign neoplasm of ascending colon: Secondary | ICD-10-CM

## 2020-06-28 DIAGNOSIS — D120 Benign neoplasm of cecum: Secondary | ICD-10-CM

## 2020-06-28 MED ORDER — SODIUM CHLORIDE 0.9 % IV SOLN: 500.0000 mL | Freq: Once | INTRAVENOUS | Status: DC

## 2020-06-28 NOTE — Progress Notes (Signed)
Pt's states no medical or surgical changes since previsit or office visit.  Vitals- Courtney 

## 2020-06-28 NOTE — Progress Notes (Signed)
Called to room to assist during endoscopic procedure.  Patient ID and intended procedure confirmed with present staff. Received instructions for my participation in the procedure from the performing physician.  

## 2020-06-28 NOTE — Op Note (Signed)
[facility] Patient Name: Jodi Hanson Procedure Date: 06/28/2020 10:48 AM MRN: [id number] Endoscopist: Remo Lipps P. Havery Moros , MD Age: 54 Referring MD:  Date of Birth: 12/26/1965 Gender: Female Account #: [id number] Procedure:                Colonoscopy Indications:              Screening for colorectal malignant neoplasm, This                            is the patient's first colonoscopy Medicines:                Monitored Anesthesia Care Procedure:                Pre-Anesthesia Assessment:                           - Prior to the procedure, a History and Physical                            was performed, and patient medications and                            allergies were reviewed. The patient's tolerance of                            previous anesthesia was also reviewed. The risks                            and benefits of the procedure and the sedation                            options and risks were discussed with the patient.                            All questions were answered, and informed consent                            was obtained. Prior Anticoagulants: The patient has                            taken no previous anticoagulant or antiplatelet                            agents. ASA Grade Assessment: II - A patient with                            mild systemic disease. After reviewing the risks                            and benefits, the patient was deemed in                            satisfactory condition to undergo the procedure.  After obtaining informed consent, the colonoscope                            was passed under direct vision. Throughout the                            procedure, the patient's blood pressure, pulse, and                            oxygen saturations were monitored continuously. The                            Colonoscope was introduced through the anus and                            advanced to the the  cecum, identified by                            appendiceal orifice and ileocecal valve. The                            colonoscopy was performed without difficulty. The                            patient tolerated the procedure well. The quality                            of the bowel preparation was good. The ileocecal                            valve, appendiceal orifice, and rectum were                            photographed. Scope In: 10:59:19 AM Scope Out: 11:18:21 AM Scope Withdrawal Time: 0 hours 16 minutes 25 seconds  Total Procedure Duration: 0 hours 19 minutes 2 seconds  Findings:                 The perianal and digital rectal examinations were                            normal.                           Three sessile polyps were found in the cecum. The                            polyps were 3 mm in size. These polyps were removed                            with a cold snare. Resection and retrieval were                            complete.  A 3 mm polyp was found in the ascending colon. The                            polyp was sessile. The polyp was removed with a                            cold snare. Resection and retrieval were complete.                           The exam was otherwise without abnormality. Complications:            No immediate complications. Estimated blood loss:                            Minimal. Estimated Blood Loss:     Estimated blood loss was minimal. Impression:               - Three 3 mm polyps in the cecum, removed with a                            cold snare. Resected and retrieved.                           - One 3 mm polyp in the ascending colon, removed                            with a cold snare. Resected and retrieved.                           - The examination was otherwise normal. Recommendation:           - Patient has a contact number available for                            emergencies. The signs and  symptoms of potential                            delayed complications were discussed with the                            patient. Return to normal activities tomorrow.                            Written discharge instructions were provided to the                            patient.                           - Resume previous diet.                           - Continue present medications.                           -  Await pathology results. Remo Lipps P. Zayanna Pundt, MD 06/28/2020 11:26:23 AM This report has been signed electronically.

## 2020-06-28 NOTE — Progress Notes (Signed)
pt tolerated well. VSS. awake and to recovery. Report given to RN.  

## 2020-06-28 NOTE — Patient Instructions (Signed)
Handout on polyps given. ° °YOU HAD AN ENDOSCOPIC PROCEDURE TODAY AT THE [facility]:   Refer to the procedure report that was given to you for any specific questions about what was found during the examination.  If the procedure report does not answer your questions, please call your gastroenterologist to clarify.  If you requested that your care partner not be given the details of your procedure findings, then the procedure report has been included in a sealed envelope for you to review at your convenience later. ° °YOU SHOULD EXPECT: Some feelings of bloating in the abdomen. Passage of more gas than usual.  Walking can help get rid of the air that was put into your GI tract during the procedure and reduce the bloating. If you had a lower endoscopy (such as a colonoscopy or flexible sigmoidoscopy) you may notice spotting of blood in your stool or on the toilet paper. If you underwent a bowel prep for your procedure, you may not have a normal bowel movement for a few days. ° °Please Note:  You might notice some irritation and congestion in your nose or some drainage.  This is from the oxygen used during your procedure.  There is no need for concern and it should clear up in a day or so. ° °SYMPTOMS TO REPORT IMMEDIATELY: ° °Following lower endoscopy (colonoscopy or flexible sigmoidoscopy): ° Excessive amounts of blood in the stool ° Significant tenderness or worsening of abdominal pains ° Swelling of the abdomen that is new, acute ° Fever of 100°F or higher ° °For urgent or emergent issues, a gastroenterologist can be reached at any hour by calling [phone]. °Do not use MyChart messaging for urgent concerns.  ° ° °DIET:  We do recommend a small meal at first, but then you may proceed to your regular diet.  Drink plenty of fluids but you should avoid alcoholic beverages for 24 hours. ° °ACTIVITY:  You should plan to take it easy for the rest of today and you should NOT DRIVE or use heavy machinery  until tomorrow (because of the sedation medicines used during the test).   ° °FOLLOW UP: °Our staff will call the number listed on your records 48-72 hours following your procedure to check on you and address any questions or concerns that you may have regarding the information given to you following your procedure. If we do not reach you, we will leave a message.  We will attempt to reach you two times.  During this call, we will ask if you have developed any symptoms of COVID 19. If you develop any symptoms (ie: fever, flu-like symptoms, shortness of breath, cough etc.) before then, please call [phone].  If you test positive for Covid 19 in the 2 weeks post procedure, please call and report this information to us.   ° °If any biopsies were taken you will be contacted by phone or by letter within the next 1-3 weeks.  Please call us at [phone] if you have not heard about the biopsies in 3 weeks.  ° ° °SIGNATURES/CONFIDENTIALITY: °You and/or your care partner have signed paperwork which will be entered into your electronic medical record.  These signatures attest to the fact that that the information above on your After Visit Summary has been reviewed and is understood.  Full responsibility of the confidentiality of this discharge information lies with you and/or your care-partner.  °

## 2020-07-02 ENCOUNTER — Telehealth: Payer: Self-pay

## 2020-07-02 NOTE — Telephone Encounter (Signed)
Second post procedure follow up call, no answer 

## 2020-07-02 NOTE — Telephone Encounter (Signed)
First post procedure follow up call, no answer 

## 2020-07-09 ENCOUNTER — Other Ambulatory Visit: Payer: 59

## 2020-07-28 ENCOUNTER — Other Ambulatory Visit: Payer: Self-pay | Admitting: Family

## 2020-09-19 ENCOUNTER — Encounter: Payer: Self-pay | Admitting: Family Medicine

## 2020-09-19 ENCOUNTER — Other Ambulatory Visit: Payer: Self-pay

## 2020-09-19 ENCOUNTER — Ambulatory Visit: Payer: 59 | Admitting: Family Medicine

## 2020-09-19 VITALS — BP 124/81 | Ht 64.0 in | Wt 138.0 lb

## 2020-09-19 DIAGNOSIS — M7741 Metatarsalgia, right foot: Secondary | ICD-10-CM

## 2020-09-19 DIAGNOSIS — M7742 Metatarsalgia, left foot: Secondary | ICD-10-CM

## 2020-09-19 NOTE — Progress Notes (Signed)
Jodi Hanson - 54 y.o. female MRN [id number]  Date of birth: 1965-11-03  SUBJECTIVE:  Including CC & ROS.  No chief complaint on file.   Jodi Hanson is a 54 y.o. female that is presenting with bilateral foot pain.  She has a history of foot pain likely related to metatarsalgia.  She has used orthotics in the past that seemed to resolve her issues.  She does not run as much as she used to.  Denies any injury or inciting events.  Reports most of the symptoms are on the medial aspect of the foot..  Review of Systems See HPI   HISTORY: Past Medical, Surgical, Social, and Family History Reviewed & Updated per EMR.   Pertinent Historical Findings include:  Past Medical History:  Diagnosis Date  . Hypertension     Past Surgical History:  Procedure Laterality Date  . CESAREAN SECTION    . HERNIA REPAIR  2005  . WISDOM TOOTH EXTRACTION      Family History  Problem Relation Age of Onset  . Diabetes Mother   . Hyperlipidemia Mother   . Hypertension Mother   . Stroke Mother   . Heart disease Father   . Hypertension Brother   . Hypertension Brother   . HIV Brother   . Hypertension Other   . Colon cancer Neg Hx   . Colon polyps Neg Hx   . Esophageal cancer Neg Hx   . Rectal cancer Neg Hx   . Stomach cancer Neg Hx     Social History   Socioeconomic History  . Marital status: Divorced    Spouse name: Not on file  . Number of children: Not on file  . Years of education: Not on file  . Highest education level: Not on file  Occupational History  . Not on file  Tobacco Use  . Smoking status: Never Smoker  . Smokeless tobacco: Never Used  Substance and Sexual Activity  . Alcohol use: Yes    Alcohol/week: 3.0 standard drinks    Types: 3 Glasses of wine per week  . Drug use: Not on file  . Sexual activity: Not on file  Other Topics Concern  . 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- lives in  [place]   Enjoys friends, food, running, walking her dog   Social Determinants of Radio broadcast assistant Strain: Not on [employer] Insecurity: Not on file  Transportation Needs: Not on file  Physical Activity: Not on file  Stress: Not on file  Social Connections: Not on file  Intimate Partner Violence: Not on file     PHYSICAL EXAM:  VS: BP 124/81   Ht 5\' 4"  (1.626 m)   Wt 138 lb (62.6 kg)   BMI 23.69 kg/m  Physical Exam Gen: NAD, alert, cooperative with exam, well-appearing MSK:  Right and left foot: Loss of the transverse arch with widening as well. Pes planus. Normal strength to resistance. Normal range of motion. Able to raise up on tiptoes. Neurovascular intact  Patient was fitted for a standard, cushioned, semi-rigid orthotic. The orthotic was heated and afterward the patient stood on the orthotic blank positioned on the orthotic stand. The patient was positioned in subtalar neutral position and 10 degrees of ankle dorsiflexion in a weight bearing stance. After completion of molding, a stable base was applied to the orthotic blank. The blank  was ground to a stable position for weight bearing. Size: 64M Pairs: 2 Base: Blue EVA Additional Posting and Padding: None The patient ambulated these, and they were very comfortable.    ASSESSMENT & PLAN:   Metatarsalgia of both feet Has pes planus with significant widening of the forefoot as well.  Has got improvement with previous orthotics. -Counseled on home exercise therapy and supportive care. -Orthotics. -Could consider adding scaphoid pads or metatarsal pads.

## 2020-09-19 NOTE — Assessment & Plan Note (Signed)
Has pes planus with significant widening of the forefoot as well.  Has got improvement with previous orthotics. -Counseled on home exercise therapy and supportive care. -Orthotics. -Could consider adding scaphoid pads or metatarsal pads.

## 2020-09-23 ENCOUNTER — Encounter: Payer: Self-pay | Admitting: Family

## 2020-09-26 ENCOUNTER — Other Ambulatory Visit: Payer: Self-pay
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
## 2020-09-30 ENCOUNTER — Other Ambulatory Visit: Payer: Self-pay

## 2020-09-30 ENCOUNTER — Telehealth: Payer: Self-pay

## 2020-09-30 ENCOUNTER — Ambulatory Visit: Payer: 59 | Admitting: Family

## 2020-09-30 VITALS — BP 140/90 | HR 87 | Temp 98.6°F | Resp 16 | Ht 64.0 in | Wt 145.0 lb

## 2020-09-30 DIAGNOSIS — I10 Essential (primary) hypertension: Secondary | ICD-10-CM | POA: Diagnosis not present

## 2020-09-30 DIAGNOSIS — I340 Nonrheumatic mitral (valve) insufficiency: Secondary | ICD-10-CM

## 2020-09-30 NOTE — Telephone Encounter (Signed)
Patient was seen this morning.
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
## 2020-09-30 NOTE — Patient Instructions (Signed)
Please complete lab work prior to leaving. Check blood pressure once daily for 1 week and then send me your readings via mychart. You should be contacted about scheduling your appointment with cardiology.

## 2020-10-07 ENCOUNTER — Other Ambulatory Visit: Payer: Self-pay

## 2020-10-07 ENCOUNTER — Encounter: Payer: Self-pay | Admitting: Family

## 2020-10-07 ENCOUNTER — Other Ambulatory Visit: Payer: 59

## 2020-10-07 DIAGNOSIS — Z20822 Contact with and (suspected) exposure to covid-19: Secondary | ICD-10-CM

## 2020-10-10 LAB — NOVEL CORONAVIRUS, NAA: SARS-CoV-2, NAA: DETECTED — AB

## 2020-10-28 NOTE — Progress Notes (Signed)
Virtual Visit via Telephone Note   This visit type was conducted due to national recommendations for restrictions regarding the COVID-19 Pandemic (e.g. social distancing) in an effort to limit this patient's exposure and mitigate transmission in our community.  Due to her co-morbid illnesses, this patient is at least at moderate risk for complications without adequate follow up.  This format is felt to be most appropriate for this patient at this time.  The patient did not have access to video technology/had technical difficulties with video requiring transitioning to audio format only (telephone).  All issues noted in this document were discussed and addressed.  No physical exam could be performed with this format.  Please refer to the patient's chart for her  consent to telehealth for [facility].    Date:  10/29/2020   ID:  Jodi Hanson, DOB 01-19-1966, MRN [id number] The patient was identified using 2 identifiers.  Patient Location: Home Provider Location: Office/Clinic  PCP:  Jodi Alar, NP  Cardiologist:  Jodi Mocha, MD   Electrophysiologist:  None   Evaluation Performed:  Follow-Up Visit  Chief Complaint:  Follow-up (Mitral regurgitation )    Patient Profile: Jodi Hanson is a 55 y.o. female with:  Hypertension   Mitral regurgitation   Moderate Stage C  Hx of Fen/Phen use in 1990s  PVCs  Prior CV Studies: Echocardiogram 11/23/2018 EF 50-55, frequent PVCs, severe conc LVH, Gr 3 DD (restrictive filling) normal RVSF, mild LAE, mod RAE, small pericardial effusion, mod to severe MR, mild to mod TR, Asc Ao 43 mm (dilated)   History of Present Illness:   Jodi Hanson was evaluated by Dr. Burt Hanson in 11/2018 for mitral regurgitation.  The plan at that time was to proceed with a TEE to better quantify her mitral regurgitation.  She had severe LVH, restrictive filling, a low normal LVF and a small pericardial effusion on echocardiogram.  A cardiac MRI was  recommended to better assess her LVF and evaluate for possible infiltrative disease.  She was also to have a Holter monitor to assess her PVC burden.  She wanted to wait 2 mos to initiate a workup at that time due to a change in her insurance.  She was to f/u in May 2020, but this is her first visit back.    She have been doing well since last seen.  She works out several days a week.  During warmer weather she runs outside, 3 mi every other day.  She is now running on the treadmill. She also does HIIT training and weights.  She has no chest pain, shortness of breath, syncope.     Past Medical History:  Diagnosis Date  . Hypertension    Past Surgical History:  Procedure Laterality Date  . CESAREAN SECTION    . HERNIA REPAIR  2005  . WISDOM TOOTH EXTRACTION       Current Meds  Medication Sig  . amLODipine (NORVASC) 2.5 MG tablet TAKE 1 TABLET BY MOUTH EVERY DAY  . Ascorbic Acid (VITAMIN C WITH ROSE HIPS) 500 MG tablet Take 500 mg by mouth daily.  Marland Kitchen aspirin EC 81 MG tablet Take 81 mg by mouth every other day.  Marland Kitchen BLACK CURRANT SEED OIL PO Take by mouth daily.  . Calcium Carbonate-Vit D-Min (CALCIUM 1200 PO) Take 1 tablet by mouth daily.  Marland Kitchen GARLIC PO Take 1 tablet by mouth daily.  . Melatonin 5 MG CAPS Take 5 mg by mouth at bedtime.  . Multiple Vitamins-Minerals (  MULTIVITAMIN WITH MINERALS) tablet Take 1 tablet by mouth daily.  . Potassium 99 MG TABS Take 1 tablet by mouth daily.  . vitamin B-12 (CYANOCOBALAMIN) 1000 MCG tablet Take 1,000 mcg by mouth daily.  . vitamin E 45 MG (100 UNITS) capsule Take 100 Units by mouth daily.     Allergies:   Patient has no known allergies.   Social History   Tobacco Use  . Smoking status: Never Smoker  . Smokeless tobacco: Never Used  Substance Use Topics  . Alcohol use: Yes    Alcohol/week: 3.0 standard drinks    Types: 3 Glasses of wine per week     Family Hx: The patient's family history includes Diabetes in her mother; HIV in her  brother; Heart disease in her father; Hyperlipidemia in her mother; Hypertension in her brother, brother, mother, and another family member; Stroke in her mother. There is no history of Colon cancer, Colon polyps, Esophageal cancer, Rectal cancer, or Stomach cancer.  ROS:   Please see the history of present illness.      Labs/Other Tests and Data Reviewed:    EKG:  No ECG reviewed.  Recent Labs: 12/22/2019: ALT 17; Hemoglobin 12.1; Platelets 316.0; TSH 0.69 03/29/2020: BUN 14; Creatinine, Ser 0.89; Potassium 4.0; Sodium 140   Recent Lipid Panel Lab Results  Component Value Date/Time   CHOL 212 (H) 12/22/2019 07:28 AM   TRIG 59.0 12/22/2019 07:28 AM   HDL 63.30 12/22/2019 07:28 AM   CHOLHDL 3 12/22/2019 07:28 AM   LDLCALC 137 (H) 12/22/2019 07:28 AM    Wt Readings from Last 3 Encounters:  10/29/20 136 lb (61.7 kg)  09/30/20 145 lb (65.8 kg)  09/19/20 138 lb (62.6 kg)     Risk Assessment/Calculations:      Objective:    Vital Signs:  Ht 5\' 4"  (1.626 m)   Wt 136 lb (61.7 kg)   BMI 23.34 kg/m    VITAL SIGNS:  reviewed GEN:  no acute distress PSYCH:  normal affect  ASSESSMENT & PLAN:    1. Moderate to severe mitral regurgitation She was last seen in 2020.  At that time, recommendation was to proceed with transesophageal echocardiogram and cardiac MRI.  She had lost her job and did not have insurance at that time and held off on scheduling.  Currently, she is doing well and is asymptomatic.  She exercises on a regular basis without difficulty.  Since it has been 2 years since her last echocardiogram I have recommended proceeding with a transthoracic echocardiogram to reassess her mitral regurgitation and LV function.  We discussed the history of the management of mitral regurgitation.  -2D echocardiogram  -Follow-up with Dr. Burt Hanson afterward  2. 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  Proceed with echocardiogram as noted above.  If she does not  require cardiac MRI to further evaluate, she will need chest CT to better size her ascending aorta.  3. Frequent PVCs I will go ahead and arrange 72-hour ZIO monitor to assess PVC burden.  Follow-up with Dr. Burt Hanson afterward.    Time:   Today, I have spent 18 minutes with the patient with telehealth technology discussing the above problems.     Medication Adjustments/Labs and Tests Ordered: Current medicines are reviewed at length with the patient today.  Concerns regarding medicines are outlined above.   Tests Ordered: Orders Placed This Encounter  Procedures  . LONG TERM MONITOR (3-14 DAYS)  . ECHOCARDIOGRAM COMPLETE    Medication  Changes: No orders of the defined types were placed in this encounter.   Follow Up:  In Person in 3 month(s)  Signed, Richardson Dopp, PA-C  10/29/2020 5:29 PM    [facility]

## 2020-10-29 ENCOUNTER — Encounter: Payer: Self-pay | Admitting: Physician Assistant

## 2020-10-29 ENCOUNTER — Encounter: Payer: Self-pay | Admitting: Radiology

## 2020-10-29 ENCOUNTER — Telehealth (INDEPENDENT_AMBULATORY_CARE_PROVIDER_SITE_OTHER): Payer: 59 | Admitting: Physician Assistant

## 2020-10-29 ENCOUNTER — Ambulatory Visit (INDEPENDENT_AMBULATORY_CARE_PROVIDER_SITE_OTHER): Payer: 59

## 2020-10-29 VITALS — Ht 64.0 in | Wt 136.0 lb

## 2020-10-29 DIAGNOSIS — I493 Ventricular premature depolarization: Secondary | ICD-10-CM

## 2020-10-29 DIAGNOSIS — I5189 Other ill-defined heart diseases: Secondary | ICD-10-CM

## 2020-10-29 DIAGNOSIS — I77810 Thoracic aortic ectasia: Secondary | ICD-10-CM | POA: Diagnosis not present

## 2020-10-29 DIAGNOSIS — I340 Nonrheumatic mitral (valve) insufficiency: Secondary | ICD-10-CM

## 2020-10-29 DIAGNOSIS — I10 Essential (primary) hypertension: Secondary | ICD-10-CM

## 2020-10-29 NOTE — Patient Instructions (Addendum)
Medication Instructions:  Your physician recommends that you continue on your current medications as directed. Please refer to the Current Medication list given to you today. *If you need a refill on your cardiac medications before your next appointment, please call your pharmacy*   Lab Work: None If you have labs (blood work) drawn today and your tests are completely normal, you will receive your results only by: Marland Kitchen MyChart Message (if you have MyChart) OR . A paper copy in the mail If you have any lab test that is abnormal or we need to change your treatment, we will call you to review the results.   Testing/Procedures:  Your physician has requested that you have an echocardiogram prior to your 3 month office visit. Echocardiography is a painless test that uses sound waves to create images of your heart. It provides your doctor with information about the size and shape of your heart and how well your heart's chambers and valves are working. This procedure takes approximately one hour. There are no restrictions for this procedure.  ZIO XT- Long Term Monitor Instructions   Your physician has requested you wear your ZIO patch monitor___3____days.   This is a single patch monitor.  [employer] supplies one patch monitor per enrollment.  Additional stickers are not available.   Please do not apply patch if you will be having a Nuclear Stress Test, Echocardiogram, Cardiac CT, MRI, or Chest Xray during the time frame you would be wearing the monitor. The patch cannot be worn during these tests.  You cannot remove and re-apply the ZIO XT patch monitor.   Your ZIO patch monitor will be sent USPS Priority mail from [facility] directly to your home address. The monitor may also be mailed to a PO BOX if home delivery is not available.   It may take 3-5 days to receive your monitor after you have been enrolled.   Once you have received you monitor, please review enclosed instructions.  Your  monitor has already been registered assigning a specific monitor serial # to you.   Applying the monitor   Shave hair from upper left chest.   Hold abrader disc by orange tab.  Rub abrader in 40 strokes over left upper chest as indicated in your monitor instructions.   Clean area with 4 enclosed alcohol pads .  Use all pads to assure are is cleaned thoroughly.  Let dry.   Apply patch as indicated in monitor instructions.  Patch will be place under collarbone on left side of chest with arrow pointing upward.   Rub patch adhesive wings for 2 minutes.Remove white label marked "1".  Remove white label marked "2".  Rub patch adhesive wings for 2 additional minutes.   While looking in a mirror, press and release button in center of patch.  A small green light will flash 3-4 times .  This will be your only indicator the monitor has been turned on.     Do not shower for the first 24 hours.  You may shower after the first 24 hours.   Press button if you feel a symptom. You will hear a small click.  Record Date, Time and Symptom in the Patient Log Book.   When you are ready to remove patch, follow instructions on last 2 pages of Patient Log Book.  Stick patch monitor onto last page of Patient Log Book.   Place Patient Log Book in [place] box.  Use locking tab on box and tape box closed  securely.  The [employer] and [employer] has [employer] on it.  Please place in mailbox as soon as possible.  Your physician should have your test results approximately 7 days after the monitor has been mailed back to [facility].   Call [facility] at [phone] if you have questions regarding your ZIO XT patch monitor.  Call them immediately if you see an orange light blinking on your monitor.   If your monitor falls off in less than 4 days contact our Monitor department at [phone].  If your monitor becomes loose or falls off after 4 days call [employer] at [phone] for suggestions on  securing your monitor.     Follow-Up: At [facility], you and your health needs are our priority.  As part of our continuing mission to provide you with exceptional heart care, we have created designated Provider Care Teams.  These Care Teams include your primary Cardiologist (physician) and Advanced Practice Providers (APPs -  Physician Assistants and Nurse Practitioners) who all work together to provide you with the care you need, when you need it.    Follow up with Dr Burt Knack after testing.

## 2020-10-29 NOTE — Progress Notes (Signed)
Enrolled patient for a 3 day Zio XT Monitor to be mailed to patients home.  °

## 2020-11-20 ENCOUNTER — Encounter: Payer: Self-pay | Admitting: Physician Assistant

## 2020-11-20 ENCOUNTER — Other Ambulatory Visit: Payer: Self-pay

## 2020-11-20 ENCOUNTER — Ambulatory Visit (HOSPITAL_COMMUNITY): Payer: 59 | Attending: Physician Assistant

## 2020-11-20 DIAGNOSIS — I340 Nonrheumatic mitral (valve) insufficiency: Secondary | ICD-10-CM | POA: Diagnosis not present

## 2020-11-20 LAB — ECHOCARDIOGRAM COMPLETE
Area-P 1/2: 3.72 cm2
MV M vel: 5.85 m/s
MV Peak grad: 136.9 mmHg
S' Lateral: 3.8 cm

## 2020-11-20 MED ORDER — PERFLUTREN LIPID MICROSPHERE
1.0000 mL | INTRAVENOUS | Status: AC | PRN
  Administered 2020-11-20 (×2): 1 mL via INTRAVENOUS

## 2020-11-26 DIAGNOSIS — I493 Ventricular premature depolarization: Secondary | ICD-10-CM | POA: Diagnosis not present

## 2020-12-04 ENCOUNTER — Ambulatory Visit: Payer: 59 | Admitting: Cardiovascular Disease

## 2020-12-13 ENCOUNTER — Encounter: Payer: Self-pay | Admitting: Physician Assistant

## 2020-12-27 ENCOUNTER — Encounter: Payer: Self-pay | Admitting: Cardiovascular Disease

## 2020-12-27 ENCOUNTER — Ambulatory Visit: Payer: 59 | Admitting: Cardiovascular Disease

## 2020-12-27 ENCOUNTER — Other Ambulatory Visit: Payer: Self-pay

## 2020-12-27 VITALS — BP 120/80 | HR 74 | Ht 64.0 in | Wt 144.2 lb

## 2020-12-27 DIAGNOSIS — I493 Ventricular premature depolarization: Secondary | ICD-10-CM

## 2020-12-27 DIAGNOSIS — I340 Nonrheumatic mitral (valve) insufficiency: Secondary | ICD-10-CM

## 2020-12-27 DIAGNOSIS — I428 Other cardiomyopathies: Secondary | ICD-10-CM

## 2020-12-27 MED ORDER — CARVEDILOL 3.125 MG PO TABS: 3.1250 mg | ORAL_TABLET | Freq: Two times a day (BID) | ORAL | 3 refills | Status: DC

## 2020-12-27 MED ORDER — DIAZEPAM 5 MG PO TABS: 5.0000 mg | ORAL_TABLET | Freq: Once | ORAL | 0 refills | Status: AC

## 2020-12-27 NOTE — Patient Instructions (Signed)
Medication Instructions:  1) STOP AMLODIPINE 2) START CARVEDILOL 3.125 mg twice daily *If you need a refill on your cardiac medications before your next appointment, please call your pharmacy*  Lab Work: TODAY! BMET If you have labs (blood work) drawn today and your tests are completely normal, you will receive your results only by: Marland Kitchen MyChart Message (if you have MyChart) OR . A paper copy in the mail If you have any lab test that is abnormal or we need to change your treatment, we will call you to review the results.  Testing/Procedures: Dr. Burt Knack recommends you have a CARDIAC MRI.  Follow-Up: At [facility], you and your health needs are our priority.  As part of our continuing mission to provide you with exceptional heart care, we have created designated Provider Care Teams.  These Care Teams include your primary Cardiologist (physician) and Advanced Practice Providers (APPs -  Physician Assistants and Nurse Practitioners) who all work together to provide you with the care you need, when you need it. Your next appointment:   6 month(s) The format for your next appointment:   In Person Provider:   You may see Sherren Mocha, MD or one of the following Advanced Practice Providers on your designated Care Team:    Richardson Dopp, PA-C  [place], [place]

## 2020-12-27 NOTE — Progress Notes (Signed)
Cardiology Office Note:    Date:  12/27/2020   ID:  DEBI COUSIN, DOB 08-Jun-1966, MRN [id number]  PCP:  Debbrah Alar, NP   [facility]  Cardiologist:  Sherren Mocha, MD  Advanced Practice Provider:  No care team member to display Electrophysiologist:  None       Referring MD: Debbrah Alar, NP   Chief Complaint  Patient presents with  . Palpitations   History of Present Illness:    Jodi Hanson is a 55 y.o. female with a hx of LVH with mild LV dysfunction, moderately severe mitral regurgitation, and frequent PVCs, presenting for follow-up evaluation.  The patient is here alone today.  I first saw her in 2020 and recommended a cardiac MRI to evaluate for infiltrative disease as well as transesophageal echo to better evaluate the functional anatomy of her mitral valve.  She declined further testing because of loss of her insurance.  She was seen in a virtual visit by Richardson Dopp October 29, 2020.  She has been doing well from a clinical perspective, but an echocardiogram has demonstrated some worsening of her LV function.  She continues to have moderate to severe mitral regurgitation.  She returns today for in person follow-up.  She is physically active with regular exercise.  She is compliant with her medications.  The patient jogs, does high intensity training, and really has no significant exertional symptoms.  She has occasional heart palpitations, but no associated symptoms.  She denies any shortness of breath, chest discomfort, edema, orthopnea, PND, or lightheadedness.  Past Medical History:  Diagnosis Date  . Hypertension   . Mitral regurgitation    Echocardiogram 2/22: EF 40-45, freq PVCs, mod conc LVH, mod to severe MR (tethering of post MV leaflet w resultant eccentric laterally directed MR), Gr 2 DD, normal RVSF, mod BAE, trivial pericardial effusion, trivial AI, mod dilation of ascending aorta (43 mm)  . PVC's (premature ventricular  contractions)    ZIO monitor 3/22: Sinus rhythm, PVC burden 9%, one 5 beat run NSVT    Past Surgical History:  Procedure Laterality Date  . CESAREAN SECTION    . HERNIA REPAIR  2005  . WISDOM TOOTH EXTRACTION      Current Medications: Current Meds  Medication Sig  . Ascorbic Acid (VITAMIN C WITH ROSE HIPS) 500 MG tablet Take 500 mg by mouth daily.  Marland Kitchen aspirin EC 81 MG tablet Take 81 mg by mouth every other day.  Marland Kitchen BLACK CURRANT SEED OIL PO Take by mouth daily.  . Calcium Carbonate-Vit D-Min (CALCIUM 1200 PO) Take 1 tablet by mouth daily.  . carvedilol (COREG) 3.125 MG tablet Take 1 tablet (3.125 mg total) by mouth 2 (two) times daily.  . diazepam (VALIUM) 5 MG tablet Take 1 tablet (5 mg total) by mouth once for 1 dose. Take 30 minutes to 1 hour prior to your cardiac MRI.  Marland Kitchen GARLIC PO Take 1 tablet by mouth daily.  . Melatonin 5 MG CAPS Take 5 mg by mouth at bedtime.  . Multiple Vitamins-Minerals (MULTIVITAMIN WITH MINERALS) tablet Take 1 tablet by mouth daily.  . Potassium 99 MG TABS Take 1 tablet by mouth daily.  . vitamin B-12 (CYANOCOBALAMIN) 1000 MCG tablet Take 1,000 mcg by mouth daily.  . vitamin E 45 MG (100 UNITS) capsule Take 100 Units by mouth daily.  . [DISCONTINUED] amLODipine (NORVASC) 2.5 MG tablet TAKE 1 TABLET BY MOUTH EVERY DAY     Allergies:   Patient  has no known allergies.   Social History   Socioeconomic History  . Marital status: Divorced    Spouse name: Not on file  . Number of children: Not on file  . Years of education: Not on file  . Highest education level: Not on file  Occupational History  . Not on file  Tobacco Use  . Smoking status: Never Smoker  . Smokeless tobacco: Never Used  Substance and Sexual Activity  . Alcohol use: Yes    Alcohol/week: 3.0 standard drinks    Types: 3 Glasses of wine per week  . Drug use: Not on file  . Sexual activity: Not on file  Other Topics Concern  . Not on file  Social History Narrative   Works as an Public house manager-  Works with [employer]   One son born 1990- lives in [place]   Enjoys friends, food, running, walking her dog   Social Determinants of Radio broadcast assistant Strain: Not on [employer] Insecurity: Not on file  Transportation Needs: Not on file  Physical Activity: Not on file  Stress: Not on file  Social Connections: Not on file     Family History: The patient's family history includes Diabetes in her mother; HIV in her brother; Heart disease in her father; Hyperlipidemia in her mother; Hypertension in her brother, brother, mother, and another family member; Stroke in her mother. There is no history of Colon cancer, Colon polyps, Esophageal cancer, Rectal cancer, or Stomach cancer.  ROS:   Please see the history of present illness.    All other systems reviewed and are negative.  EKGs/Labs/Other Studies Reviewed:    The following studies were reviewed today: ZIO monitor: Study Highlights   Patch Wear Time:  3 days and 10 hours (2022-03-01T23:09:17-0500 to 2022-03-05T09:09:39-0500)  Patient had a min HR of 44 bpm, max HR of 141 bpm, and avg HR of 74 bpm. Predominant underlying rhythm was Sinus Rhythm. 1 run of Ventricular Tachycardia occurred lasting 5 beats with a max rate of 141 bpm (avg 134 bpm). Isolated SVEs were rare (<1.0%),  SVE Couplets were rare (<1.0%), and no SVE Triplets were present. Isolated VEs were frequent (9.0%, 31950), VE Couplets were rare (<1.0%, 326), and VE Triplets were rare (<1.0%, 1). Ventricular Bigeminy and Trigeminy were present.   Summary: The basic rhythm is normal sinus with an average heart rate of 74 bpm There are frequent PVCs with an overall PVC burden of 9% There is one 5 beat ventricular run  There is no atrial arrhythmia, high-grade AV block, or sustained ventricular arrhythmia.   Echo 11/20/2020: IMPRESSIONS  1. Left ventricular ejection fraction, by estimation, is 40 to 45%. The  left ventricle has mildly  decreased function. The left ventricle  demonstrates global hypokinesis. The left ventricular internal cavity size  was mildly dilated. Frequent PVCs during  the study.  2. There is moderate concentric left ventricular hypertrophy.  3. There is tethering of the posterior mitral valve leaflet with  resultant eccentric, laterally directed moderate-to-severe mitral  regurgitation.  4. Left ventricular diastolic parameters are consistent with Grade II  diastolic dysfunction (pseudonormalization).  5. Right ventricular systolic function is normal. The right ventricular  size is normal. There is normal pulmonary artery systolic pressure.  6. Left atrial size was moderately dilated.  7. Right atrial size was moderately dilated.  8. There is a trivial pericardial effusion posterior to the left  ventricle.  9. The aortic valve is tricuspid. Aortic  valve regurgitation is trivial.  No aortic stenosis is present.  10. Aortic dilatation noted. There is moderate dilatation of the ascending  aorta, measuring 43 mm.  11. The inferior vena cava is normal in size with greater than 50%  respiratory variability, suggesting right atrial pressure of 3 mmHg.  12. Given eccentricity of mitral regurgitation, may consider MRI vs TEE to  better quantify degree/severity of MR.   Comparison(s): Compared to prior TTE in 2020, the LVEF appears mildly  reduced to 40-45%. Filling pressures improved with G2DD. Otherwise, no  significant change.   EKG:  EKG is ordered today.  The ekg ordered today demonstrates normal sinus rhythm 74 bpm, frequent PVCs, otherwise normal.  Recent Labs: 03/29/2020: BUN 14; Creatinine, Ser 0.89; Potassium 4.0; Sodium 140  Recent Lipid Panel    Component Value Date/Time   CHOL 212 (H) 12/22/2019 0728   TRIG 59.0 12/22/2019 0728   HDL 63.30 12/22/2019 0728   CHOLHDL 3 12/22/2019 0728   VLDL 11.8 12/22/2019 0728   LDLCALC 137 (H) 12/22/2019 0728     Risk  Assessment/Calculations:       Physical Exam:    VS:  BP 120/80   Pulse 74   Ht 5\' 4"  (1.626 m)   Wt 144 lb 3.2 oz (65.4 kg)   SpO2 99%   BMI 24.75 kg/m     Wt Readings from Last 3 Encounters:  12/27/20 144 lb 3.2 oz (65.4 kg)  10/29/20 136 lb (61.7 kg)  09/30/20 145 lb (65.8 kg)     GEN:  Well nourished, well developed in no acute distress HEENT: Normal NECK: No JVD; No carotid bruits LYMPHATICS: No lymphadenopathy CARDIAC: RRR, 2/6 holosystolic murmur at the apex RESPIRATORY:  Clear to auscultation without rales, wheezing or rhonchi  ABDOMEN: Soft, non-tender, non-distended MUSCULOSKELETAL:  No edema; No deformity  SKIN: Warm and dry NEUROLOGIC:  Alert and oriented x 3 PSYCHIATRIC:  Normal affect   ASSESSMENT:    1. Severe mitral regurgitation   2. NICM (nonischemic cardiomyopathy) ([place])   3. Frequent PVCs    PLAN:    In order of problems listed above:  1. The patient likely has secondary mitral regurgitation.  She is completely asymptomatic.  I think it is appropriate to focus treatment on her LV dysfunction/congestive heart failure at this time.  I do not think she has a primary component to her mitral regurgitation.  At some point we may proceed with transesophageal echo, but see below for discussion of current plans. 2. Considerations include familial cardiomyopathy (history of CHF in her father and other family members), hypertensive heart disease, infiltrative cardiomyopathy, ischemic cardiomyopathy less likely.  Recommend cardiac MRI with and without gadolinium contrast for delayed enhancement.  Stop amlodipine.  Start carvedilol 3.125 mg twice daily.  Arrange close follow-up in the Pharm.D. clinic for titration of carvedilol and addition of Entresto as tolerated. 3. 9% PVC burden.  Titrate beta-blocker as tolerated.  Check cardiac MRI.  Medication Adjustments/Labs and Tests Ordered: Current medicines are reviewed at length with the patient today.  Concerns  regarding medicines are outlined above.  Orders Placed This Encounter  Procedures  . MR Card Morphology Wo/W Cm  . Basic metabolic panel  . AMB Referral to [facility]  . EKG 12-Lead   Meds ordered this encounter  Medications  . carvedilol (COREG) 3.125 MG tablet    Sig: Take 1 tablet (3.125 mg total) by mouth 2 (two) times daily.    Dispense:  180 tablet  Refill:  3  . diazepam (VALIUM) 5 MG tablet    Sig: Take 1 tablet (5 mg total) by mouth once for 1 dose. Take 30 minutes to 1 hour prior to your cardiac MRI.    Dispense:  1 tablet    Refill:  0    Patient Instructions  Medication Instructions:  1) STOP AMLODIPINE 2) START CARVEDILOL 3.125 mg twice daily *If you need a refill on your cardiac medications before your next appointment, please call your pharmacy*  Lab Work: TODAY! BMET If you have labs (blood work) drawn today and your tests are completely normal, you will receive your results only by: Marland Kitchen MyChart Message (if you have MyChart) OR . A paper copy in the mail If you have any lab test that is abnormal or we need to change your treatment, we will call you to review the results.  Testing/Procedures: Dr. Burt Knack recommends you have a CARDIAC MRI.  Follow-Up: At [facility], you and your health needs are our priority.  As part of our continuing mission to provide you with exceptional heart care, we have created designated Provider Care Teams.  These Care Teams include your primary Cardiologist (physician) and Advanced Practice Providers (APPs -  Physician Assistants and Nurse Practitioners) who all work together to provide you with the care you need, when you need it. Your next appointment:   6 month(s) The format for your next appointment:   In Person Provider:   You may see Sherren Mocha, MD or one of the following Advanced Practice Providers on your designated Care Team:    Richardson Dopp, PA-C  [place], [place]      Signed, Sherren Mocha, MD   12/27/2020 10:32 PM    [facility]

## 2020-12-30 ENCOUNTER — Other Ambulatory Visit: Payer: Self-pay

## 2020-12-30 ENCOUNTER — Ambulatory Visit: Payer: 59 | Admitting: Family

## 2020-12-30 ENCOUNTER — Encounter: Payer: Self-pay | Admitting: Family

## 2020-12-30 ENCOUNTER — Telehealth: Payer: Self-pay | Admitting: Cardiovascular Disease

## 2020-12-30 VITALS — BP 149/86 | HR 57 | Temp 98.0°F | Resp 16 | Wt 146.0 lb

## 2020-12-30 DIAGNOSIS — I10 Essential (primary) hypertension: Secondary | ICD-10-CM | POA: Diagnosis not present

## 2020-12-30 NOTE — Telephone Encounter (Signed)
Spoke with patient regarding preferred weekdays and times for scheduling the Cardiac MRI ordered by Dr. Burt Knack.  Informed patient as soon as we hear regarding the insurance prior authorization, I will be in touch with the appointment information---patient voiced her understanding.

## 2020-12-30 NOTE — Progress Notes (Signed)
Subjective:    Patient ID: Jodi Hanson, female    DOB: 04-15-1966, 55 y.o.   MRN: [id number]  HPI  Patient is a 55 yr old female who presents today for follow up.  HTN- medications includes coreg 3.125mg  bid.  BP Readings from Last 3 Encounters:  12/30/20 (!) 149/86  12/27/20 120/80  09/30/20 140/90   She sent home readings as below:  Date Time BP Pulse DIA SYS 11/29/20 11:47PM 141/84 59 141 84 11/28/20 11:44PM 104/79 63 104 79 11/26/20 11:43PM 120/75 61 120 75 12/26/20 8:28AM 107/77 107 77 60 12/15/20 10:30AM 108/79 108 79 47 12/10/20 12:04PM 101/89 101 89 67 11/28/20 8:30AM 113/83 113 38 59  BP Readings from Last 3 Encounters:  12/30/20 (!) 149/86  12/27/20 120/80  09/30/20 140/90   Wt Readings from Last 3 Encounters:  12/30/20 146 lb (66.2 kg)  12/27/20 144 lb 3.2 oz (65.4 kg)  10/29/20 136 lb (61.7 kg)    Review of Systems See HPI  Past Medical History:  Diagnosis Date  . Hypertension   . Mitral regurgitation    Echocardiogram 2/22: EF 40-45, freq PVCs, mod conc LVH, mod to severe MR (tethering of post MV leaflet w resultant eccentric laterally directed MR), Gr 2 DD, normal RVSF, mod BAE, trivial pericardial effusion, trivial AI, mod dilation of ascending aorta (43 mm)  . PVC's (premature ventricular contractions)    ZIO monitor 3/22: Sinus rhythm, PVC burden 9%, one 5 beat run NSVT     Social History   Socioeconomic History  . Marital status: Divorced    Spouse name: Not on file  . Number of children: Not on file  . Years of education: Not on file  . Highest education level: Not on file  Occupational History  . Not on file  Tobacco Use  . Smoking status: Never Smoker  . Smokeless tobacco: Never Used  Substance and Sexual Activity  . Alcohol use: Yes    Alcohol/week: 3.0 standard drinks    Types: 3 Glasses of wine per week  . Drug use: Not on file  . Sexual activity: Not on file  Other Topics Concern  . 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 CESAREAN SECTION    . HERNIA REPAIR  2005  . WISDOM TOOTH EXTRACTION      Family History  Problem Relation Age of Onset  . Diabetes Mother   . Hyperlipidemia Mother   . Hypertension Mother   . Stroke Mother   . Heart disease Father   . Hypertension Brother   . Hypertension Brother   . HIV Brother   . Hypertension Other   . Colon cancer Neg Hx   . Colon polyps Neg Hx   . Esophageal cancer Neg Hx   . Rectal cancer Neg Hx   . Stomach cancer Neg Hx     No Known Allergies  Current Outpatient Medications on File Prior to Visit  Medication Sig  Dispense Refill  . Ascorbic Acid (VITAMIN C WITH ROSE HIPS) 500 MG tablet Take 500 mg by mouth daily.    Marland Kitchen aspirin EC 81 MG tablet Take 81 mg by mouth every other day.    Marland Kitchen BLACK CURRANT SEED OIL PO Take by mouth daily.    . Calcium Carbonate-Vit D-Min (CALCIUM 1200 PO) Take 1 tablet by mouth daily.    . carvedilol (COREG) 3.125 MG tablet Take 1 tablet (3.125 mg total) by mouth 2 (two) times daily. 180 tablet 3  . GARLIC PO Take 1 tablet by mouth daily.    . Melatonin 5 MG CAPS Take 5 mg by mouth at bedtime.    . Multiple Vitamins-Minerals (MULTIVITAMIN WITH MINERALS) tablet Take 1 tablet by mouth daily.    . Potassium 99 MG TABS Take 1 tablet by mouth daily.    . vitamin B-12 (CYANOCOBALAMIN) 1000 MCG tablet Take 1,000 mcg by mouth daily.    . vitamin E 45 MG (100 UNITS) capsule Take 100 Units by mouth daily.     No current facility-administered medications on file prior to  visit.    BP (!) 149/86 (BP Location: Right Arm, Patient Position: Sitting, Cuff Size: Small)   Pulse (!) 57   Temp 98 F (36.7 C) (Oral)   Resp 16   Wt 146 lb (66.2 kg)   SpO2 100%   BMI 25.06 kg/m       Objective:   Physical Exam Constitutional:      Appearance: She is well-developed.  Cardiovascular:     Rate and Rhythm: Normal rate and regular rhythm.     Heart sounds: Normal heart sounds. No murmur heard.   Pulmonary:     Effort: Pulmonary effort is normal. No respiratory distress.     Breath sounds: Normal breath sounds. No wheezing.  Psychiatric:        Behavior: Behavior normal.        Thought Content: Thought content normal.        Judgment: Judgment normal.           Assessment & Plan:  HTN- BP mildly elevated today, but home readings are much better. She thinks bp is high today because she did not sleep well last night.  Pt saw cardiology on Friday 4/1 and amlodipine was d/c'd and she was started on coreg 3.125 which she began last night. She will continue to monitor her BP at home and let me know if her BP runs high.   She is due for cpx/pap smear. Will plan next visit.   This visit occurred during the SARS-CoV-2 public health emergency.  Safety protocols were in place, including screening questions prior to the visit, additional usage of staff PPE, and extensive cleaning of exam room while observing appropriate contact time as indicated for disinfecting solutions.

## 2021-01-02 ENCOUNTER — Encounter: Payer: Self-pay | Admitting: Cardiovascular Disease

## 2021-01-02 NOTE — Telephone Encounter (Signed)
Spoke with patient regarding the Friday 02/21/21 8:00 am Cardiac MRI appointment at [facility]----arrival time is 7:30 am --1st floor admissions office for check in.  Will mail information to patient and it is available in My Chart.  Patient voiced her understanding.

## 2021-01-20 ENCOUNTER — Encounter: Payer: Self-pay | Admitting: Family Medicine

## 2021-01-20 ENCOUNTER — Other Ambulatory Visit: Payer: Self-pay

## 2021-01-20 ENCOUNTER — Ambulatory Visit (INDEPENDENT_AMBULATORY_CARE_PROVIDER_SITE_OTHER): Payer: 59 | Admitting: Family Medicine

## 2021-01-20 DIAGNOSIS — M7741 Metatarsalgia, right foot: Secondary | ICD-10-CM

## 2021-01-20 DIAGNOSIS — M7742 Metatarsalgia, left foot: Secondary | ICD-10-CM | POA: Diagnosis not present

## 2021-01-20 NOTE — Progress Notes (Signed)
  Jodi Hanson - 55 y.o. female MRN [id number]  Date of birth: February 27, 1966  SUBJECTIVE:  Including CC & ROS.  No chief complaint on file.   Jodi Hanson is a 55 y.o. female that is following up for her bilateral foot pain.  Her previous orthotics were shifting and causing her pain.   Review of Systems See HPI   HISTORY: Past Medical, Surgical, Social, and Family History Reviewed & Updated per EMR.   Pertinent Historical Findings include:  Past Medical History:  Diagnosis Date  . Hypertension   . Mitral regurgitation    Echocardiogram 2/22: EF 40-45, freq PVCs, mod conc LVH, mod to severe MR (tethering of post MV leaflet w resultant eccentric laterally directed MR), Gr 2 DD, normal RVSF, mod BAE, trivial pericardial effusion, trivial AI, mod dilation of ascending aorta (43 mm)  . PVC's (premature ventricular contractions)    ZIO monitor 3/22: Sinus rhythm, PVC burden 9%, one 5 beat run NSVT    Past Surgical History:  Procedure Laterality Date  . CESAREAN SECTION    . HERNIA REPAIR  2005  . WISDOM TOOTH EXTRACTION      Family History  Problem Relation Age of Onset  . Diabetes Mother   . Hyperlipidemia Mother   . Hypertension Mother   . Stroke Mother   . Heart disease Father   . Hypertension Brother   . Hypertension Brother   . HIV Brother   . Hypertension Other   . Colon cancer Neg Hx   . Colon polyps Neg Hx   . Esophageal cancer Neg Hx   . Rectal cancer Neg Hx   . Stomach cancer Neg Hx     Social History   Socioeconomic History  . Marital status: Divorced    Spouse name: Not on file  . Number of children: Not on file  . Years of education: Not on file  . Highest education level: Not on file  Occupational History  . Not on file  Tobacco Use  . Smoking status: Never Smoker  . Smokeless tobacco: Never Used  Substance and Sexual Activity  . Alcohol use: Yes    Alcohol/week: 3.0 standard drinks    Types: 3 Glasses of wine per week  . Drug use: Not on  file  . Sexual activity: Not on file  Other Topics Concern  . 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- lives in [place]   Enjoys friends, food, running, walking her dog   Social Determinants of Radio broadcast assistant Strain: Not on [employer] Insecurity: Not on file  Transportation Needs: Not on file  Physical Activity: Not on file  Stress: Not on file  Social Connections: Not on file  Intimate Partner Violence: Not on file     PHYSICAL EXAM:  VS: BP 130/86 (BP Location: Left Arm, Patient Position: Sitting, Cuff Size: Normal)   Ht 5\' 4"  (1.626 m)   Wt 146 lb (66.2 kg)   BMI 25.06 kg/m  Physical Exam Gen: NAD, alert, cooperative with exam, well-appearing   ASSESSMENT & PLAN:   Metatarsalgia of both feet Previous orthotics are causing some pain with running.  She was having shifting of the shoe. -Counseled on home exercise therapy and supportive care. -Blue Poron was placed over the forefoot with an extension to coverage of the toes.

## 2021-01-20 NOTE — Assessment & Plan Note (Signed)
Previous orthotics are causing some pain with running.  She was having shifting of the shoe. -Counseled on home exercise therapy and supportive care. -Blue Poron was placed over the forefoot with an extension to coverage of the toes.

## 2021-01-23 ENCOUNTER — Other Ambulatory Visit: Payer: Self-pay | Admitting: Family

## 2021-01-24 ENCOUNTER — Ambulatory Visit (INDEPENDENT_AMBULATORY_CARE_PROVIDER_SITE_OTHER): Payer: 59 | Admitting: Pharmacist

## 2021-01-24 ENCOUNTER — Other Ambulatory Visit: Payer: Self-pay

## 2021-01-24 ENCOUNTER — Other Ambulatory Visit: Payer: 59 | Admitting: *Deleted

## 2021-01-24 DIAGNOSIS — I493 Ventricular premature depolarization: Secondary | ICD-10-CM

## 2021-01-24 DIAGNOSIS — I5020 Unspecified systolic (congestive) heart failure: Secondary | ICD-10-CM

## 2021-01-24 DIAGNOSIS — I340 Nonrheumatic mitral (valve) insufficiency: Secondary | ICD-10-CM

## 2021-01-24 DIAGNOSIS — I428 Other cardiomyopathies: Secondary | ICD-10-CM

## 2021-01-24 LAB — BASIC METABOLIC PANEL
BUN/Creatinine Ratio: 10 (ref 9–23)
BUN: 10 mg/dL (ref 6–24)
CO2: 25 mmol/L (ref 20–29)
Calcium: 9.3 mg/dL (ref 8.7–10.2)
Chloride: 100 mmol/L (ref 96–106)
Creatinine, Ser: 0.96 mg/dL (ref 0.57–1.00)
Glucose: 107 mg/dL — ABNORMAL HIGH (ref 65–99)
Potassium: 4 mmol/L (ref 3.5–5.2)
Sodium: 139 mmol/L (ref 134–144)
eGFR: 71 mL/min/{1.73_m2} (ref >59)

## 2021-01-24 MED ORDER — ENTRESTO 24-26 MG PO TABS: 1.0000 | ORAL_TABLET | Freq: Two times a day (BID) | ORAL | 0 refills | Status: DC

## 2021-01-24 NOTE — Progress Notes (Signed)
Patient ID: [name]                 DOB: September 30, 1965                      MRN: [id number]     HPI: Jodi Hanson is a 55 y.o. female referred by Dr. Burt Knack to pharmacy clinic for HF medication management. PMH is significant for LVH with mild LV dysfunction, moderately severe mitral regurgitation, and frequent PVCs. She recently had a decline in her EF. Most recent LVEF 40-45% on 11/20/20  Today she presents to pharmacy clinic for further medication titration. At last visit with MD amlodipine was stopped and carvedilol 3.125mg  BID was started.. Symptomatically, she is feeling great, denies dizziness, lightheadedness, and fatigue. No chest pain or palpitations. No SOB. Able to complete all ADLs. Activity level high.  No LEE, PND, or orthopnea. Patient is asymptomatic. She is active and exercises regularly. She tries to adhere to a good diet. Researches nutrition. Has been fasting 1-2 days a week for the past year.  Current CHF meds: carvedilol 3.125mg  BID Previously tried: amlodipine 5mg  (changed to carvedilol for GDMT) BP goal: <130/80  Family History: The patient's family history includes Diabetes in her mother; HIV in her brother; Heart disease in her father; Hyperlipidemia in her mother; Hypertension in her brother, brother, mother, and another family member; Stroke in her mother. There is no history of Colon cancer, Colon polyps, Esophageal cancer, Rectal cancer, or Stomach cancer.  Social History: 1 glass a wine a week, never smoked  Diet: -fasts 1 or 2 days a week, limits sugar  Exercise: jogs, does high intensity training  Home BP readings: 606'T systolic  Wt Readings from Last 3 Encounters:  01/20/21 146 lb (66.2 kg)  12/30/20 146 lb (66.2 kg)  12/27/20 144 lb 3.2 oz (65.4 kg)   BP Readings from Last 3 Encounters:  01/20/21 130/86  12/30/20 (!) 149/86  12/27/20 120/80   Pulse Readings from Last 3 Encounters:  12/30/20 (!) 57  12/27/20 74  09/30/20 87    Renal  function: CrCl cannot be calculated (Patient's most recent lab result is older than the maximum 21 days allowed.).  Past Medical History:  Diagnosis Date  . Hypertension   . Mitral regurgitation    Echocardiogram 2/22: EF 40-45, freq PVCs, mod conc LVH, mod to severe MR (tethering of post MV leaflet w resultant eccentric laterally directed MR), Gr 2 DD, normal RVSF, mod BAE, trivial pericardial effusion, trivial AI, mod dilation of ascending aorta (43 mm)  . PVC's (premature ventricular contractions)    ZIO monitor 3/22: Sinus rhythm, PVC burden 9%, one 5 beat run NSVT    Current Outpatient Medications on File Prior to Visit  Medication Sig Dispense Refill  . amLODipine (NORVASC) 2.5 MG tablet TAKE 1 TABLET BY MOUTH EVERY DAY 90 tablet 1  . Ascorbic Acid (VITAMIN C WITH ROSE HIPS) 500 MG tablet Take 500 mg by mouth daily.    Marland Kitchen aspirin EC 81 MG tablet Take 81 mg by mouth every other day.    Marland Kitchen BLACK CURRANT SEED OIL PO Take by mouth daily.    . Calcium Carbonate-Vit D-Min (CALCIUM 1200 PO) Take 1 tablet by mouth daily.    . carvedilol (COREG) 3.125 MG tablet Take 1 tablet (3.125 mg total) by mouth 2 (two) times daily. 180 tablet 3  . GARLIC PO Take 1 tablet by mouth daily.    . Melatonin  5 MG CAPS Take 5 mg by mouth at bedtime.    . Multiple Vitamins-Minerals (MULTIVITAMIN WITH MINERALS) tablet Take 1 tablet by mouth daily.    . Potassium 99 MG TABS Take 1 tablet by mouth daily.    . vitamin B-12 (CYANOCOBALAMIN) 1000 MCG tablet Take 1,000 mcg by mouth daily.    . vitamin E 45 MG (100 UNITS) capsule Take 100 Units by mouth daily.     No current facility-administered medications on file prior to visit.    No Known Allergies   Assessment/Plan:  1. CHF - Patient is asymptomatic. Discussed the reasoning behind medications in HFrEF. HR is 55 today in clinic. Continue carvedilol 3.125mg  BID. Unsure if we will be able to titrate this in the future. Start Entresto 24/26mg  BID. No PA was  needed with insurance. Copay cards provided to patient. Follow up in 2-3 weeks in clinic with BMP and BP check.  Recommended [name] book on fasting, Spoon Fed and Gut Feelings if she wants to do more research on nutrition.   Thank you,  Ramond Dial, Pharm.D, BCPS, CPP [facility]  [address] [place], [place] [address]  Phone: [phone]; Fax: [phone]

## 2021-01-24 NOTE — Patient Instructions (Signed)
It was nice to meet you!  Please start taking Entresto 24/26mg  twice a day Continue carvedilol 3.125mg  twice a day  Call me at [phone] with any questions

## 2021-01-27 ENCOUNTER — Other Ambulatory Visit (HOSPITAL_BASED_OUTPATIENT_CLINIC_OR_DEPARTMENT_OTHER): Payer: Self-pay | Admitting: Family

## 2021-01-27 ENCOUNTER — Other Ambulatory Visit: Payer: Self-pay

## 2021-01-27 ENCOUNTER — Ambulatory Visit: Payer: 59 | Admitting: Podiatry

## 2021-01-27 DIAGNOSIS — Z1231 Encounter for screening mammogram for malignant neoplasm of breast: Secondary | ICD-10-CM

## 2021-01-27 DIAGNOSIS — L309 Dermatitis, unspecified: Secondary | ICD-10-CM | POA: Diagnosis not present

## 2021-01-27 MED ORDER — TERBINAFINE HCL 250 MG PO TABS: ORAL_TABLET | ORAL | 0 refills | Status: DC

## 2021-01-29 ENCOUNTER — Other Ambulatory Visit: Payer: Self-pay

## 2021-01-29 ENCOUNTER — Other Ambulatory Visit (HOSPITAL_COMMUNITY)
Admission: RE | Admit: 2021-01-29 | Discharge: 2021-01-29 | Disposition: A | Discharge status: Home / Self Care | Payer: 59 | Source: Ambulatory Visit | Attending: Family | Admitting: Family

## 2021-01-29 ENCOUNTER — Encounter: Payer: Self-pay | Admitting: Family

## 2021-01-29 ENCOUNTER — Ambulatory Visit (INDEPENDENT_AMBULATORY_CARE_PROVIDER_SITE_OTHER): Payer: 59 | Admitting: Family

## 2021-01-29 VITALS — BP 128/74 | HR 55 | Temp 97.8°F | Resp 16 | Ht 64.0 in | Wt 130.0 lb

## 2021-01-29 DIAGNOSIS — R7989 Other specified abnormal findings of blood chemistry: Secondary | ICD-10-CM

## 2021-01-29 DIAGNOSIS — Z23 Encounter for immunization: Secondary | ICD-10-CM

## 2021-01-29 DIAGNOSIS — E785 Hyperlipidemia, unspecified: Secondary | ICD-10-CM

## 2021-01-29 DIAGNOSIS — Z0000 Encounter for general adult medical examination without abnormal findings: Secondary | ICD-10-CM

## 2021-01-29 DIAGNOSIS — Z862 Personal history of diseases of the blood and blood-forming organs and certain disorders involving the immune mechanism: Secondary | ICD-10-CM

## 2021-01-29 DIAGNOSIS — Z01419 Encounter for gynecological examination (general) (routine) without abnormal findings: Secondary | ICD-10-CM | POA: Insufficient documentation

## 2021-01-29 DIAGNOSIS — R945 Abnormal results of liver function studies: Secondary | ICD-10-CM

## 2021-01-29 LAB — HEPATIC FUNCTION PANEL
ALT: 19 U/L (ref 0–35)
AST: 20 U/L (ref 0–37)
Albumin: 4.4 g/dL (ref 3.5–5.2)
Alkaline Phosphatase: 82 U/L (ref 39–117)
Bilirubin, Direct: 0.1 mg/dL (ref 0.0–0.3)
Total Bilirubin: 0.7 mg/dL (ref 0.2–1.2)
Total Protein: 7 g/dL (ref 6.0–8.3)

## 2021-01-29 LAB — CBC WITH DIFFERENTIAL/PLATELET
Basophils Absolute: 0 10*3/uL (ref 0.0–0.1)
Basophils Relative: 0.9 % (ref 0.0–3.0)
Eosinophils Absolute: 0.1 10*3/uL (ref 0.0–0.7)
Eosinophils Relative: 3.4 % (ref 0.0–5.0)
HCT: 39.1 % (ref 36.0–46.0)
Hemoglobin: 12.7 g/dL (ref 12.0–15.0)
Lymphocytes Relative: 47.5 % — ABNORMAL HIGH (ref 12.0–46.0)
Lymphs Abs: 1.9 10*3/uL (ref 0.7–4.0)
MCHC: 32.4 g/dL (ref 30.0–36.0)
MCV: 78.2 fl (ref 78.0–100.0)
Monocytes Absolute: 0.5 10*3/uL (ref 0.1–1.0)
Monocytes Relative: 11.5 % (ref 3.0–12.0)
Neutro Abs: 1.5 10*3/uL (ref 1.4–7.7)
Neutrophils Relative %: 36.7 % — ABNORMAL LOW (ref 43.0–77.0)
Platelets: 277 10*3/uL (ref 150.0–400.0)
RBC: 5.01 Mil/uL (ref 3.87–5.11)
RDW: 14.8 % (ref 11.5–15.5)
WBC: 4 10*3/uL (ref 4.0–10.5)

## 2021-01-29 LAB — LIPID PANEL
Cholesterol: 227 mg/dL — ABNORMAL HIGH (ref 0–200)
HDL: 62.6 mg/dL (ref >39.00)
LDL Cholesterol: 148 mg/dL — ABNORMAL HIGH (ref 0–99)
NonHDL: 164.57
Total CHOL/HDL Ratio: 4
Triglycerides: 83 mg/dL (ref 0.0–149.0)
VLDL: 16.6 mg/dL (ref 0.0–40.0)

## 2021-01-29 NOTE — Progress Notes (Signed)
Subjective:   Patient ID: Jodi Hanson, female   DOB: 55 y.o.   MRN: [id number]   HPI Patient presents stating that she has had dry skin for years gets thick callus formation that her mom also has was concerned about some dark discoloration spots on the bottom of both heels.  States that she could eventually develop cracks between her toes due to the dryness   Review of Systems  All other systems reviewed and are negative.       Objective:  Physical Exam Vitals and nursing note reviewed.  Constitutional:      Appearance: She is well-developed.  Pulmonary:     Effort: Pulmonary effort is normal.  Musculoskeletal:        General: Normal range of motion.  Skin:    General: Skin is warm.  Neurological:     Mental Status: She is alert.     Neurovascular status found to be intact muscle strength found to be adequate range of motion adequate patient found to have problems of skin discoloration with thickness but appears to be more just her natural pigment with cracking of the digits with possible fungal infiltration that is difficult to quantitate.  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  I did go ahead and because there may be some fungal infiltration between the toes due to moisture and the dry skin condition I placed her on a pulse Lamisil treatment currently and patient will be seen back as needed or dermatologist

## 2021-01-29 NOTE — Progress Notes (Signed)
Subjective:   By signing my name below, I, Shehryar Baig, attest that this documentation has been prepared under the direction and in the presence of Sandford Craze, NP. 01/29/2021     Patient ID: Jodi Hanson, female    DOB: [birth year], 55 y.o.   MRN: [id number]  No chief complaint on file.   HPI Patient is in today for a comprehensive physical exam. She reports finding a tick on her this morning  after gardening previously. She removed it and reports not feel any symptoms at this time.  She occasionally drinks alcohol. She does not smoke or use drugs.  Hypertension: Her blood pressure is well managed at this time. ACE was discontinued by cardiology.   BP Readings from Last 3 Encounters:  01/29/21 128/74  01/24/21 134/76  01/20/21 130/86   Immunizations: She is UTD on all Covid 19 vaccinations. She is UTD on tetanus. She is willing to get the shingles vaccination today, 01/29/2021. Diet: She is eating a healthy diet. Exercise: She participates in exercise by running every other day and lifts weights on days she is not running. Colonoscopy: Last completed 06/28/2020. 3 21mm polyps removed in the cecum, 1 67mm polyp removed in the ascending colon, otherwise results normal. Dexa: Last completed 12/02/2017 Pap Smear: Last completed 11/18/2017. Results normal. Mammogram: Last completed 03/01/2020. Results normal. Repeat in 1 year. Dental: She is not UTD on dental. Vision: She is UTD on vision.   Past Medical History:  Diagnosis Date  . Hypertension   . Mitral regurgitation    Echocardiogram 2/22: EF 40-45, freq PVCs, mod conc LVH, mod to severe MR (tethering of post MV leaflet w resultant eccentric laterally directed MR), Gr 2 DD, normal RVSF, mod BAE, trivial pericardial effusion, trivial AI, mod dilation of ascending aorta (43 mm)  . PVC's (premature ventricular contractions)    ZIO monitor 3/22: Sinus rhythm, PVC burden 9%, one 5 beat run NSVT    Past Surgical History:   Procedure Laterality Date  . CESAREAN SECTION    . HERNIA REPAIR  2005  . WISDOM TOOTH EXTRACTION      Family History  Problem Relation Age of Onset  . Diabetes Mother   . Hyperlipidemia Mother   . Hypertension Mother   . Stroke Mother   . Heart disease Father   . Hypertension Brother   . Hypertension Brother   . HIV Brother   . Hypertension Other   . Colon cancer Neg Hx   . Colon polyps Neg Hx   . Esophageal cancer Neg Hx   . Rectal cancer Neg Hx   . Stomach cancer Neg Hx     Social History   Socioeconomic History  . Marital status: Divorced    Spouse name: Not on file  . Number of children: Not on file  . Years of education: Not on file  . Highest education level: Not on file  Occupational History  . Not on file  Tobacco Use  . Smoking status: Never Smoker  . Smokeless tobacco: Never Used  Substance and Sexual Activity  . Alcohol use: Yes    Alcohol/week: 3.0 standard drinks    Types: 3 Glasses of wine per week  . Drug use: Not on file  . Sexual activity: Not on file  Other Topics Concern  . Not on file  Social History Narrative   Works as an Psychologist, educational-  Works with EMR systems   One son born 1990- lives in [place]  Enjoys friends, food, running, walking her dog   Social Determinants of Radio broadcast assistant Strain: Not on file  Food Insecurity: Not on file  Transportation Needs: Not on file  Physical Activity: Not on file  Stress: Not on file  Social Connections: Not on file  Intimate Partner Violence: Not on file    Outpatient Medications Prior to Visit  Medication Sig Dispense Refill  . Ascorbic Acid (VITAMIN C WITH ROSE HIPS) 500 MG tablet Take 500 mg by mouth daily.    Marland Kitchen aspirin EC 81 MG tablet Take 81 mg by mouth every other day.    Marland Kitchen BLACK CURRANT SEED OIL PO Take by mouth daily.    . Calcium Carbonate-Vit D-Min (CALCIUM 1200 PO) Take 1 tablet by mouth daily.    . carvedilol (COREG) 3.125 MG tablet Take 1 tablet (3.125 mg  total) by mouth 2 (two) times daily. 180 tablet 3  . GARLIC PO Take 1 tablet by mouth daily.    . Melatonin 5 MG CAPS Take 5 mg by mouth at bedtime.    . Multiple Vitamins-Minerals (MULTIVITAMIN WITH MINERALS) tablet Take 1 tablet by mouth daily.    . Potassium 99 MG TABS Take 1 tablet by mouth daily.    . sacubitril-valsartan (ENTRESTO) 24-26 MG Take 1 tablet by mouth 2 (two) times daily. 60 tablet 0  . terbinafine (LAMISIL) 250 MG tablet Please take one a day x 7days, repeat every 4 weeks x 4 months 28 tablet 0  . vitamin B-12 (CYANOCOBALAMIN) 1000 MCG tablet Take 1,000 mcg by mouth daily.    . vitamin E 45 MG (100 UNITS) capsule Take 100 Units by mouth daily.     No facility-administered medications prior to visit.    No Known Allergies  Review of Systems  Constitutional: Negative for weight loss.  HENT: Negative for congestion.   Eyes: Negative for blurred vision.  Respiratory: Negative for cough.   Cardiovascular: Negative for leg swelling.  Gastrointestinal: Negative for constipation and diarrhea.  Genitourinary: Negative for dysuria and frequency.  Musculoskeletal: Negative for joint pain and myalgias.  Skin: Negative for rash.  Neurological: Negative for headaches.  Psychiatric/Behavioral:       Denies any significant depression/anxiety       Objective:    Physical Exam Exam conducted with a chaperone present.  Constitutional:      Appearance: Normal appearance.  HENT:     Right Ear: Tympanic membrane and external ear normal.     Left Ear: Tympanic membrane and external ear normal.  Eyes:     Extraocular Movements: Extraocular movements intact.     Pupils: Pupils are equal, round, and reactive to light.     Comments: No nystagmus.  Cardiovascular:     Rate and Rhythm: Normal rate and regular rhythm.     Pulses: Normal pulses.     Heart sounds: Normal heart sounds.  Pulmonary:     Effort: Pulmonary effort is normal. No respiratory distress.     Breath sounds:  Normal breath sounds. No wheezing, rhonchi or rales.  Chest:     Chest wall: No mass.  Breasts:     Right: Normal. No mass.     Left: Normal. No mass.      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     Palpations: Abdomen is soft.  Genitourinary:    General: Normal vulva.     Exam position: Lithotomy position.  Labia:        Right: No rash.        Left: No rash.      Vagina: Normal.     Cervix: Normal.     Uterus: Normal.      Adnexa: Right adnexa normal and left adnexa normal.  Musculoskeletal:     Comments: 5/5 strength in both upper and lower extremities.  Lymphadenopathy:     Cervical: No cervical adenopathy.  Skin:    General: Skin is warm and dry.  Neurological:     Mental Status: She is alert and oriented to person, place, and time.     Comments: Normal patellar reflexes  Psychiatric:        Behavior: Behavior normal.     There were no vitals taken for this visit. Wt Readings from Last 3 Encounters:  01/20/21 146 lb (66.2 kg)  12/30/20 146 lb (66.2 kg)  12/27/20 144 lb 3.2 oz (65.4 kg)    Diabetic Foot Exam - Simple   No data filed    Lab Results  Component Value Date   WBC 4.3 12/22/2019   HGB 12.1 12/22/2019   HCT 37.6 12/22/2019   PLT 316.0 12/22/2019   GLUCOSE 107 (H) 01/24/2021   CHOL 212 (H) 12/22/2019   TRIG 59.0 12/22/2019   HDL 63.30 12/22/2019   LDLCALC 137 (H) 12/22/2019   ALT 17 12/22/2019   AST 23 12/22/2019   NA 139 01/24/2021   K 4.0 01/24/2021   CL 100 01/24/2021   CREATININE 0.96 01/24/2021   BUN 10 01/24/2021   CO2 25 01/24/2021   TSH 0.69 12/22/2019    Lab Results  Component Value Date   TSH 0.69 12/22/2019   Lab Results  Component Value Date   WBC 4.3 12/22/2019   HGB 12.1 12/22/2019   HCT 37.6 12/22/2019   MCV 79.2 12/22/2019   PLT 316.0 12/22/2019   Lab Results  Component Value Date   NA 139 01/24/2021   K 4.0 01/24/2021   CO2 25 01/24/2021    GLUCOSE 107 (H) 01/24/2021   BUN 10 01/24/2021   CREATININE 0.96 01/24/2021   BILITOT 1.2 12/22/2019   ALKPHOS 80 12/22/2019   AST 23 12/22/2019   ALT 17 12/22/2019   PROT 6.8 12/22/2019   ALBUMIN 4.2 12/22/2019   CALCIUM 9.3 01/24/2021   EGFR 71 01/24/2021   GFR 80.73 03/29/2020   Lab Results  Component Value Date   CHOL 212 (H) 12/22/2019   Lab Results  Component Value Date   HDL 63.30 12/22/2019   Lab Results  Component Value Date   LDLCALC 137 (H) 12/22/2019   Lab Results  Component Value Date   TRIG 59.0 12/22/2019   Lab Results  Component Value Date   CHOLHDL 3 12/22/2019   No results found for: HGBA1C     Assessment & Plan:   Problem List Items Addressed This Visit   None      No orders of the defined types were placed in this encounter.     I,Shehryar Multimedia programmer as a Education administrator for [employer], NP.,have documented all relevant documentation on the behalf of Nance Pear, NP,as directed by  Nance Pear, NP while in the presence of Nance Pear, NP.   Shehryar Rae Roam NP, personally preformed the services described in this documentation.  All medical record entries made by the scribe were at my direction and in my presence.  I have  reviewed the chart and discharge instructions (if applicable) and agree that the record reflects my personal performance and is accurate and complete. 01/29/2021

## 2021-01-29 NOTE — Assessment & Plan Note (Addendum)
She is eating healthy and exercising regularly. Encouraged her to continue.  She will schedule mammogram this summer.  Colo up to date. Shingrix #1 today.  Pap performed today. Labs as ordered.

## 2021-01-29 NOTE — Patient Instructions (Addendum)
Please schedule a routine eye exam.  Complete lab work prior to leaving. Continue your work on healthy diet and exercise.

## 2021-01-30 LAB — CYTOLOGY - PAP
Comment: NEGATIVE
Diagnosis: NEGATIVE
High risk HPV: NEGATIVE

## 2021-02-01 ENCOUNTER — Encounter (HOSPITAL_BASED_OUTPATIENT_CLINIC_OR_DEPARTMENT_OTHER): Payer: 59

## 2021-02-01 DIAGNOSIS — Z1231 Encounter for screening mammogram for malignant neoplasm of breast: Secondary | ICD-10-CM

## 2021-02-11 ENCOUNTER — Other Ambulatory Visit (HOSPITAL_BASED_OUTPATIENT_CLINIC_OR_DEPARTMENT_OTHER): Payer: Self-pay | Admitting: Family

## 2021-02-11 DIAGNOSIS — Z1231 Encounter for screening mammogram for malignant neoplasm of breast: Secondary | ICD-10-CM

## 2021-02-13 ENCOUNTER — Other Ambulatory Visit: Payer: Self-pay

## 2021-02-13 ENCOUNTER — Ambulatory Visit (INDEPENDENT_AMBULATORY_CARE_PROVIDER_SITE_OTHER): Payer: 59 | Admitting: Pharmacist

## 2021-02-13 VITALS — BP 116/80 | HR 67

## 2021-02-13 DIAGNOSIS — I10 Essential (primary) hypertension: Secondary | ICD-10-CM | POA: Diagnosis not present

## 2021-02-13 DIAGNOSIS — I5020 Unspecified systolic (congestive) heart failure: Secondary | ICD-10-CM

## 2021-02-13 LAB — BASIC METABOLIC PANEL
BUN/Creatinine Ratio: 12 (ref 9–23)
BUN: 8 mg/dL (ref 6–24)
CO2: 23 mmol/L (ref 20–29)
Calcium: 9.1 mg/dL (ref 8.7–10.2)
Chloride: 88 mmol/L — ABNORMAL LOW (ref 96–106)
Creatinine, Ser: 0.66 mg/dL (ref 0.57–1.00)
Glucose: 106 mg/dL — ABNORMAL HIGH (ref 65–99)
Potassium: 4 mmol/L (ref 3.5–5.2)
Sodium: 127 mmol/L — ABNORMAL LOW (ref 134–144)
eGFR: 105 mL/min/{1.73_m2} (ref >59)

## 2021-02-13 NOTE — Patient Instructions (Addendum)
It was nice to see you again!  Continue carvedilol 3.125mg  twice a day and Entresto 24/26mg  twice a day  I will call you tomorrow with your lab results  Please call me at [phone] with any questions

## 2021-02-13 NOTE — Progress Notes (Signed)
Patient ID: Jodi Hanson                 DOB: Apr 24, 1966                      MRN: [id number]     HPI: Jodi Hanson is a 55 y.o. female referred by Dr. Burt Knack to pharmacy clinic for HF medication management. PMH is significant for LVH with mild LV dysfunction, moderately severe mitral regurgitation, and frequent PVCs. She recently had a decline in her EF. Most recent LVEF 40-45% on 11/20/20  Today she presents to pharmacy clinic for further medication titration. At last visit Entresto 24/26mg  twice a day was started. Symptomatically, she is feeling great, denies dizziness, lightheadedness, and fatigue. No chest pain or palpitations. No SOB. Able to complete all ADLs. Activity level high, exercising 5 days a week. Likes to run and wants to start doing more strength training.  No LEE, PND, or orthopnea. Patient is asymptomatic. She tries to adhere to a good diet. Researches nutrition. Has been fasting 1-2 days a week for the past year.  Current CHF meds: carvedilol 3.125mg  twice a day, Entresto 24/26 twice a day Previously tried: amlodipine 5mg  (changed to carvedilol for GDMT) BP goal: <130/80  Family History: The patient's family history includes Diabetes in her mother; HIV in her brother; Heart disease in her father; Hyperlipidemia in her mother; Hypertension in her brother, brother, mother, and another family member; Stroke in her mother. There is no history of Colon cancer, Colon polyps, Esophageal cancer, Rectal cancer, or Stomach cancer.  Social History: 1 glass a wine a week, never smoked  Diet: -fasts 1 or 2 days a week, limits sugar  Exercise: jogs, does high intensity training- 5 days a week  Home BP readings: 118/78 Close to 120/80  Wt Readings from Last 3 Encounters:  01/29/21 130 lb (59 kg)  01/20/21 146 lb (66.2 kg)  12/30/20 146 lb (66.2 kg)   BP Readings from Last 3 Encounters:  01/29/21 128/74  01/24/21 134/76  01/20/21 130/86   Pulse Readings from Last 3  Encounters:  01/29/21 (!) 55  01/24/21 (!) 55  12/30/20 (!) 57    Renal function: CrCl cannot be calculated (Unknown ideal weight.).  Past Medical History:  Diagnosis Date  . Hypertension   . Mitral regurgitation    Echocardiogram 2/22: EF 40-45, freq PVCs, mod conc LVH, mod to severe MR (tethering of post MV leaflet w resultant eccentric laterally directed MR), Gr 2 DD, normal RVSF, mod BAE, trivial pericardial effusion, trivial AI, mod dilation of ascending aorta (43 mm)  . PVC's (premature ventricular contractions)    ZIO monitor 3/22: Sinus rhythm, PVC burden 9%, one 5 beat run NSVT    Current Outpatient Medications on File Prior to Visit  Medication Sig Dispense Refill  . Ascorbic Acid (VITAMIN C WITH ROSE HIPS) 500 MG tablet Take 500 mg by mouth daily.    Marland Kitchen BLACK CURRANT SEED OIL PO Take by mouth daily.    . Calcium Carbonate-Vit D-Min (CALCIUM 1200 PO) Take 1 tablet by mouth daily.    . carvedilol (COREG) 3.125 MG tablet Take 1 tablet (3.125 mg total) by mouth 2 (two) times daily. 180 tablet 3  . GARLIC PO Take 1 tablet by mouth daily.    . Melatonin 5 MG CAPS Take 5 mg by mouth at bedtime.    . Multiple Vitamins-Minerals (MULTIVITAMIN WITH MINERALS) tablet Take 1 tablet by mouth daily.    Marland Kitchen  Potassium 99 MG TABS Take 1 tablet by mouth daily.    . sacubitril-valsartan (ENTRESTO) 24-26 MG Take 1 tablet by mouth 2 (two) times daily. 60 tablet 0  . terbinafine (LAMISIL) 250 MG tablet Please take one a day x 7days, repeat every 4 weeks x 4 months 28 tablet 0  . vitamin B-12 (CYANOCOBALAMIN) 1000 MCG tablet Take 1,000 mcg by mouth daily.    . vitamin E 45 MG (100 UNITS) capsule Take 100 Units by mouth daily.     No current facility-administered medications on file prior to visit.    No Known Allergies   Assessment/Plan:  1. CHF - Patient is asymptomatic. Blood pressure at goal of <808/81 (diastolic boardline). Will check BMP today. If BMP stable will plan to increase  Entresto to 49/51mg  twice a day. Will schedule follow up once labs result and plan is made. Hesitant to increase carvedilol as patients HR is in 50-60's. She is very active and would hate to cause exercise intolerance. She is asymptomatic, therefore will not consider spironolactone or SGLT2 at this time, but will discuss with Dr. Burt Knack. She is to have a cardiac MRI 5/27.    Thank you,  Ramond Dial, Pharm.D, BCPS, CPP [facility]  [address] [place], [place] [address]  Phone: [phone]; Fax: [phone]

## 2021-02-14 ENCOUNTER — Telehealth: Payer: Self-pay | Admitting: Pharmacist

## 2021-02-14 NOTE — Telephone Encounter (Signed)
Moderate hyponatremia. Patient is asymptomatic. I suspect it is from [place]. I will have patient hold Entresto and repeat labs at next appointment. Continue carvedilol 3.125mg  twice a day. She can take amlodipine if her BP is elevated in the mean time. Will recheck in 3 week (first available apt). Will re-evaluate at that time. We could challenge with just an ARB, but we may run into the same problem.  I spoke with patient who is in agreement with the plan.

## 2021-02-18 ENCOUNTER — Other Ambulatory Visit: Payer: Self-pay | Admitting: Cardiovascular Disease

## 2021-02-18 ENCOUNTER — Inpatient Hospital Stay (HOSPITAL_BASED_OUTPATIENT_CLINIC_OR_DEPARTMENT_OTHER): Admission: RE | Admit: 2021-02-18 | Payer: 59 | Source: Ambulatory Visit

## 2021-02-19 NOTE — Telephone Encounter (Signed)
Called [facility] and gave verbal order for diazepam 5 mg #1 (no refills) to be taken 30-60 minutes prior to cardiac MRI.

## 2021-02-20 ENCOUNTER — Telehealth (HOSPITAL_COMMUNITY): Payer: Self-pay | Admitting: Emergency Medicine

## 2021-02-20 NOTE — Telephone Encounter (Signed)
Attempted to call patient regarding upcoming cardiac CT appointment. Left message on voicemail with name and callback number Jodi Bond RN Navigator Cardiac Imaging [facility] [phone] Office [phone] Cell    Left detailed message about having a designated driver since shes taking diazepam prior to scan Jodi Hanson

## 2021-02-21 ENCOUNTER — Other Ambulatory Visit: Payer: Self-pay | Admitting: Cardiovascular Disease

## 2021-02-21 ENCOUNTER — Other Ambulatory Visit: Payer: Self-pay

## 2021-02-21 ENCOUNTER — Ambulatory Visit (HOSPITAL_COMMUNITY)
Admission: RE | Admit: 2021-02-21 | Discharge: 2021-02-21 | Disposition: A | Discharge status: Home / Self Care | Payer: 59 | Source: Ambulatory Visit | Attending: Cardiovascular Disease | Admitting: Cardiovascular Disease

## 2021-02-21 DIAGNOSIS — I493 Ventricular premature depolarization: Secondary | ICD-10-CM | POA: Diagnosis present

## 2021-02-21 DIAGNOSIS — I428 Other cardiomyopathies: Secondary | ICD-10-CM | POA: Insufficient documentation

## 2021-02-21 DIAGNOSIS — I340 Nonrheumatic mitral (valve) insufficiency: Secondary | ICD-10-CM | POA: Insufficient documentation

## 2021-02-21 MED ORDER — GADOBUTROL 1 MMOL/ML IV SOLN
6.0000 mL | Freq: Once | INTRAVENOUS | Status: AC | PRN
  Administered 2021-02-21: 6 mL via INTRAVENOUS

## 2021-03-07 ENCOUNTER — Ambulatory Visit: Payer: 59

## 2021-03-07 NOTE — Progress Notes (Deleted)
Patient ID: Jodi Hanson                 DOB: Nov 11, 1965                      MRN: [id number]     HPI: Jodi Hanson is a 55 y.o. female referred by Dr. Burt Knack to pharmacy clinic for HF medication management. PMH is significant for LVH with mild LV dysfunction, moderately severe mitral regurgitation, and frequent PVCs. She recently had a decline in her EF. Most recent LVEF 40-45% on 11/20/20  Today she presents to pharmacy clinic for further medication titration. At previous visits she was started on Entresto 24/26mg  twice a day. However after repeat labs, patient has moderate hyponatremia and Entresto was held. Symptomatically, she is feeling great, denies dizziness, lightheadedness, and fatigue. No chest pain or palpitations. No SOB. Able to complete all ADLs. Activity level high, exercising 5 days a week. Likes to run and wants to start doing more strength training.  No LEE, PND, or orthopnea. Patient is asymptomatic. She tries to adhere to a good diet. Researches nutrition. Has been fasting 1-2 days a week for the past year.  Repeat BMP Challenge with ARB? Arlyce Harman or SGLT2 Dizziness, lightheadedness, headache, blurred vision, SOB, swelling Home BP   Current CHF meds: carvedilol 3.125mg  twice a day Previously tried: amlodipine 5mg  (changed to carvedilol for GDMT) BP goal: <130/80  Family History: The patient's family history includes Diabetes in her mother; HIV in her brother; Heart disease in her father; Hyperlipidemia in her mother; Hypertension in her brother, brother, mother, and another family member; Stroke in her mother. There is no history of Colon cancer, Colon polyps, Esophageal cancer, Rectal cancer, or Stomach cancer.  Social History: 1 glass a wine a week, never smoked  Diet: -fasts 1 or 2 days a week, limits sugar  Exercise: jogs, does high intensity training- 5 days a week  Home BP readings: 118/78 Close to 120/80  Wt Readings from Last 3 Encounters:  01/29/21 130 lb  (59 kg)  01/20/21 146 lb (66.2 kg)  12/30/20 146 lb (66.2 kg)   BP Readings from Last 3 Encounters:  02/13/21 116/80  01/29/21 128/74  01/24/21 134/76   Pulse Readings from Last 3 Encounters:  02/13/21 67  01/29/21 (!) 55  01/24/21 (!) 55    Renal function: CrCl cannot be calculated (Patient's most recent lab result is older than the maximum 21 days allowed.).  Past Medical History:  Diagnosis Date   Hypertension    Mitral regurgitation    Echocardiogram 2/22: EF 40-45, freq PVCs, mod conc LVH, mod to severe MR (tethering of post MV leaflet w resultant eccentric laterally directed MR), Gr 2 DD, normal RVSF, mod BAE, trivial pericardial effusion, trivial AI, mod dilation of ascending aorta (43 mm)   PVC's (premature ventricular contractions)    ZIO monitor 3/22: Sinus rhythm, PVC burden 9%, one 5 beat run NSVT    Current Outpatient Medications on File Prior to Visit  Medication Sig Dispense Refill   amLODipine (NORVASC) 5 MG tablet Take if BP is elevated while Entresto is on hold 180 tablet 3   Ascorbic Acid (VITAMIN C WITH ROSE HIPS) 500 MG tablet Take 500 mg by mouth daily.     BLACK CURRANT SEED OIL PO Take by mouth daily.     Calcium Carbonate-Vit D-Min (CALCIUM 1200 PO) Take 1 tablet by mouth daily.     carvedilol (COREG) 3.125 MG  tablet Take 1 tablet (3.125 mg total) by mouth 2 (two) times daily. 180 tablet 3   diazepam (VALIUM) 5 MG tablet TAKE 1 TABLET BY MOUTH 30-60 MINUTES PRIOR TO CARDIAC MRI 1 tablet 0   GARLIC PO Take 1 tablet by mouth daily.     Melatonin 5 MG CAPS Take 5 mg by mouth at bedtime.     Multiple Vitamins-Minerals (MULTIVITAMIN WITH MINERALS) tablet Take 1 tablet by mouth daily.     Potassium 99 MG TABS Take 1 tablet by mouth daily.     terbinafine (LAMISIL) 250 MG tablet Please take one a day x 7days, repeat every 4 weeks x 4 months 28 tablet 0   vitamin B-12 (CYANOCOBALAMIN) 1000 MCG tablet Take 1,000 mcg by mouth daily.     vitamin E 45 MG (100  UNITS) capsule Take 100 Units by mouth daily.     No current facility-administered medications on file prior to visit.    No Known Allergies   Assessment/Plan:  1. CHF - Patient is asymptomatic. Blood pressure at goal of <917/91 (diastolic boardline). Will check BMP today. If BMP stable will plan to increase Entresto to 49/51mg  twice a day. Will schedule follow up once labs result and plan is made. Hesitant to increase carvedilol as patients HR is in 50-60's. She is very active and would hate to cause exercise intolerance. She is asymptomatic, therefore will not consider spironolactone or SGLT2 at this time, but will discuss with Dr. Burt Knack. She is to have a cardiac MRI 5/27.    Thank you,  Ramond Dial, Pharm.D, BCPS, CPP [facility]  [address] [place], [place] [address]  Phone: [phone]; Fax: [phone]

## 2021-03-10 ENCOUNTER — Other Ambulatory Visit: Payer: Self-pay

## 2021-03-10 ENCOUNTER — Ambulatory Visit (HOSPITAL_BASED_OUTPATIENT_CLINIC_OR_DEPARTMENT_OTHER)
Admission: RE | Admit: 2021-03-10 | Discharge: 2021-03-10 | Disposition: A | Discharge status: Home / Self Care | Payer: 59 | Source: Ambulatory Visit | Attending: Family | Admitting: Family

## 2021-03-10 ENCOUNTER — Encounter (HOSPITAL_BASED_OUTPATIENT_CLINIC_OR_DEPARTMENT_OTHER): Payer: Self-pay

## 2021-03-10 DIAGNOSIS — Z1231 Encounter for screening mammogram for malignant neoplasm of breast: Secondary | ICD-10-CM | POA: Insufficient documentation

## 2021-03-26 ENCOUNTER — Ambulatory Visit (INDEPENDENT_AMBULATORY_CARE_PROVIDER_SITE_OTHER): Payer: 59 | Admitting: Pharmacist

## 2021-03-26 ENCOUNTER — Other Ambulatory Visit: Payer: Self-pay

## 2021-03-26 VITALS — BP 122/84 | HR 57

## 2021-03-26 DIAGNOSIS — I5020 Unspecified systolic (congestive) heart failure: Secondary | ICD-10-CM | POA: Diagnosis not present

## 2021-03-26 LAB — BASIC METABOLIC PANEL
BUN/Creatinine Ratio: 13 (ref 9–23)
BUN: 12 mg/dL (ref 6–24)
CO2: 25 mmol/L (ref 20–29)
Calcium: 9.4 mg/dL (ref 8.7–10.2)
Chloride: 101 mmol/L (ref 96–106)
Creatinine, Ser: 0.93 mg/dL (ref 0.57–1.00)
Glucose: 89 mg/dL (ref 65–99)
Potassium: 4.8 mmol/L (ref 3.5–5.2)
Sodium: 138 mmol/L (ref 134–144)
eGFR: 74 mL/min/{1.73_m2} (ref >59)

## 2021-03-26 NOTE — Progress Notes (Signed)
Patient ID: Jodi Hanson                 DOB: [birth year]                      MRN: [id number]     HPI: Jodi Hanson is a 55 y.o. female referred by Dr. Burt Knack to pharmacy clinic for HF medication management. PMH is significant for LVH with mild LV dysfunction, moderately severe mitral regurgitation, and frequent PVCs. She recently had a decline in her EF. Most recent LVEF 40-45% on 11/20/20. Cardiac MRI 02/21/21 showed EF 50%, moderate mitral regurgitation, mild LV hypertrophy, and suggestion of prior MI in basal to mid inferior and inferolateral walls. Pt recently started on Entresto, however Na decreased from 139 to 127 and Cl decreased from 100 to 88. [name] was held and pt presents today for follow up.  Pt presents today for follow up. Reports feeling well. Denies dizziness, lightheadedness, headache, LE edema, or SOB. Able to complete all ADLs. Activity level high, exercising 5 days a week. Likes to run and wants to start doing more strength training. She tries to adhere to a heart-healthy diet. Researches nutrition. Has been fasting 1-2 days a week for the past year. She has been taking amlodipine 5mg  daily again since stopping Entresto because her BP had started increasing again. Entresto better controlled BP (systolics in the 510C), amlodipine still controls it well with systolics in the 585I. Checked BP at home this AM before visit, 126/80.  Current CHF meds:  Carvedilol 3.125mg  twice a day -  9am and 9pm Entresto 24/26 twice a day - on hold Amlodipine 5mg  daily - 9pm, resumed taking  Previously tried: amlodipine 5mg  (changed to carvedilol for GDMT)  BP goal: <130/47mmHg  Family History: The patient's family history includes Diabetes in her mother; HIV in her brother; Heart disease in her father; Hyperlipidemia in her mother; Hypertension in her brother, brother, mother, and another family member; Stroke in her mother. There is no history of Colon cancer, Colon polyps, Esophageal  cancer, Rectal cancer, or Stomach cancer.  Social History: 1 glass a wine a week, never smoked  Diet: fasts 1 or 2 days a week, limits sugar  Exercise: jogs, does high intensity training 5 days a week  Home BP readings: 118/78, close to 120/80  Wt Readings from Last 3 Encounters:  01/29/21 130 lb (59 kg)  01/20/21 146 lb (66.2 kg)  12/30/20 146 lb (66.2 kg)   BP Readings from Last 3 Encounters:  02/13/21 116/80  01/29/21 128/74  01/24/21 134/76   Pulse Readings from Last 3 Encounters:  02/13/21 67  01/29/21 (!) 55  01/24/21 (!) 55    Renal function: CrCl cannot be calculated (Patient's most recent lab result is older than the maximum 21 days allowed.).  Past Medical History:  Diagnosis Date   Hypertension    Mitral regurgitation    Echocardiogram 2/22: EF 40-45, freq PVCs, mod conc LVH, mod to severe MR (tethering of post MV leaflet w resultant eccentric laterally directed MR), Gr 2 DD, normal RVSF, mod BAE, trivial pericardial effusion, trivial AI, mod dilation of ascending aorta (43 mm)   PVC's (premature ventricular contractions)    ZIO monitor 3/22: Sinus rhythm, PVC burden 9%, one 5 beat run NSVT    Current Outpatient Medications on File Prior to Visit  Medication Sig Dispense Refill   amLODipine (NORVASC) 5 MG tablet Take if BP is elevated while [name] is  on hold 180 tablet 3   Ascorbic Acid (VITAMIN C WITH ROSE HIPS) 500 MG tablet Take 500 mg by mouth daily.     BLACK CURRANT SEED OIL PO Take by mouth daily.     Calcium Carbonate-Vit D-Min (CALCIUM 1200 PO) Take 1 tablet by mouth daily.     carvedilol (COREG) 3.125 MG tablet Take 1 tablet (3.125 mg total) by mouth 2 (two) times daily. 180 tablet 3   diazepam (VALIUM) 5 MG tablet TAKE 1 TABLET BY MOUTH 30-60 MINUTES PRIOR TO CARDIAC MRI 1 tablet 0   GARLIC PO Take 1 tablet by mouth daily.     Melatonin 5 MG CAPS Take 5 mg by mouth at bedtime.     Multiple Vitamins-Minerals (MULTIVITAMIN WITH MINERALS) tablet  Take 1 tablet by mouth daily.     Potassium 99 MG TABS Take 1 tablet by mouth daily.     terbinafine (LAMISIL) 250 MG tablet Please take one a day x 7days, repeat every 4 weeks x 4 months 28 tablet 0   vitamin B-12 (CYANOCOBALAMIN) 1000 MCG tablet Take 1,000 mcg by mouth daily.     vitamin E 45 MG (100 UNITS) capsule Take 100 Units by mouth daily.     No current facility-administered medications on file prior to visit.    No Known Allergies   Assessment/Plan:  1. CHF with LVEF 50% - BP at goal <130/64mmHg. Rechecking BMET today to see if electrolytes have stabilized after stopping Entresto. Pending lab results, ideally wish to rechallenge with low dose Entresto if able. Otherwise, can consider valsartan instead. Pt to continue on carvedilol 3.125mg  BID (dose titration limited by HR in the 50s). She'll continue on amlodipine until she hears from [place] with lab results, will plan to stop if we are able to start either Entresto or valsartan. SGLT2i is another option to pursue as well.  Eliese Kerwood E. Jolynn Bajorek, PharmD, BCACP, [facility] [address] [place], [place] [address] Phone: [phone]; Fax: [phone] 03/26/2021 9:06 AM

## 2021-03-26 NOTE — Patient Instructions (Signed)
It was nice to see you today!  Your blood pressure goal is < 130/70mmHg  We'll call you when your labs come back. Ideally, we'd like to try you back on the Entresto again and stop the amlodipine. If your sodium/chloride stay on the lower end of the range, we could also try starting valsartan instead (one of the medications in [place])

## 2021-03-27 ENCOUNTER — Telehealth: Payer: Self-pay | Admitting: Pharmacist

## 2021-03-27 MED ORDER — CARVEDILOL 3.125 MG PO TABS: 3.1250 mg | ORAL_TABLET | Freq: Two times a day (BID) | ORAL | 3 refills | Status: DC

## 2021-03-27 MED ORDER — DAPAGLIFLOZIN PROPANEDIOL 10 MG PO TABS: 10.0000 mg | ORAL_TABLET | Freq: Every day | ORAL | 5 refills | Status: DC

## 2021-03-27 MED ORDER — AMLODIPINE BESYLATE 5 MG PO TABS: 5.0000 mg | ORAL_TABLET | Freq: Every day | ORAL | 5 refills | Status: DC

## 2021-03-27 NOTE — Telephone Encounter (Signed)
Patient returned call. I reviewed her lab results with her and the plan to continue carvedilol 3.125mg  BID, amlodipine 5mg  daily and start [place]. Copay card sent in. She is agreeable to the plan.  She did have some questions about her MRI and would like Dr. Burt Knack to call her to review as she missed his phone call previously. Advised I would send a message to Dr. Burt Knack. She said she does not want to start seeing a whole bunch of specialists until she really understands what's going on.  Scheduled in clinic for follow up 8/12 (a little longer than 4 weeks due to vacations)

## 2021-03-27 NOTE — Telephone Encounter (Signed)
BMET normalized after [name] was stopped. Na increased from 127 to 138, Cl increased from 88 to 101. K surprisingly increased from 4 to 4.8.  Will hold off on rechallenging with Entresto or trying valsartan due to rise in K. Instead, will start SGLT2i for HFpEF benefit. Wilder Glade is on her formulary, will start 10mg  daily. Copay card sent to pharmacy (ID [id number], BIN [id number], PCN [id number], GRP [id number], confirmed $0 copay). Will also have her continue amlodipine 5mg  daily for BP control as well as carvedilol 3.125mg  BID for HFpEF (HR limiting dose titration).  Called pt and left message to discuss addition of Wilder Glade and continuation of amlodipine. Will need follow up scheduled in ~4 weeks. Can recheck BMET at that time and reassess potential Entresto or valsartan rechallenge.

## 2021-03-28 NOTE — Telephone Encounter (Signed)
Thanks and appreciate you managing her medical therapy.  I just had a long discussion with her.  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  She has no symptoms of angina and in fact has no symptoms with her physically active lifestyle.  I think continuing on with medical therapy is appropriate.  She does not wish to see a heart failure specialist at this point.  Thanks again

## 2021-05-02 ENCOUNTER — Ambulatory Visit: Payer: 59

## 2021-05-02 NOTE — Progress Notes (Deleted)
Patient here for second Shingrix vaccine.   Vaccine given in    and patient tolerated well.

## 2021-05-09 ENCOUNTER — Ambulatory Visit: Payer: 59

## 2021-05-21 ENCOUNTER — Ambulatory Visit (INDEPENDENT_AMBULATORY_CARE_PROVIDER_SITE_OTHER): Payer: 59 | Admitting: Pharmacist

## 2021-05-21 ENCOUNTER — Other Ambulatory Visit: Payer: Self-pay

## 2021-05-21 VITALS — BP 122/92 | HR 65 | Wt 136.0 lb

## 2021-05-21 DIAGNOSIS — I10 Essential (primary) hypertension: Secondary | ICD-10-CM

## 2021-05-21 DIAGNOSIS — I5020 Unspecified systolic (congestive) heart failure: Secondary | ICD-10-CM

## 2021-05-21 LAB — BASIC METABOLIC PANEL
BUN/Creatinine Ratio: 12 (ref 9–23)
BUN: 11 mg/dL (ref 6–24)
CO2: 28 mmol/L (ref 20–29)
Calcium: 9.3 mg/dL (ref 8.7–10.2)
Chloride: 103 mmol/L (ref 96–106)
Creatinine, Ser: 0.92 mg/dL (ref 0.57–1.00)
Glucose: 82 mg/dL (ref 65–99)
Potassium: 4.8 mmol/L (ref 3.5–5.2)
Sodium: 142 mmol/L (ref 134–144)
eGFR: 75 mL/min/{1.73_m2} (ref >59)

## 2021-05-21 NOTE — Patient Instructions (Addendum)
It was nice to see you today  Your blood pressure goal is < 130/1mHg  We will recheck your labs today, if they're stable we will stop amlodipine and restart low dose Entresto 24-'26mg'$  1 tablet twice daily

## 2021-05-21 NOTE — Progress Notes (Signed)
Patient ID: Jodi Hanson                 DOB: 04-Jan-1966                      MRN: [id number]     HPI: Jodi Hanson is a 55 y.o. female referred by Dr. Burt Knack to pharmacy clinic for HF medication management. PMH is significant for LVH with mild LV dysfunction, moderately severe mitral regurgitation, and frequent PVCs. She recently had a decline in her EF. Most recent LVEF 40-45% on 11/20/20. Cardiac MRI 02/21/21 showed EF 50%, moderate mitral regurgitation, mild LV hypertrophy, and suggestion of prior MI in basal to mid inferior and inferolateral walls. Pt recently started on Entresto, however Na decreased from 139 to 127 and Cl decreased from 100 to 88. Jodi Hanson was held, follow up labs normalized off therapy, although K increased from 4 to 4.8. She was started on Farxiga '10mg'$  daily and presents today for follow up.  Pt presents today for follow up. Reports feeling well. Denies dizziness, headache, LE edema, CP, and SOB. Activity level high, exercising 5 days a week. Has been doing more strength training. She tries to adhere to a heart-healthy diet. Researches nutrition. Has been fasting 1-2 days a week for the past year. Tolerating medications well. BP at home has been < 120/80s.  Current CHF meds:  Carvedilol 3.'125mg'$  twice a day -  9am and 9pm Farxiga '10mg'$  daily - 9am Amlodipine '5mg'$  daily - 9pm, resumed taking  Previously tried: amlodipine '5mg'$  (changed to carvedilol for GDMT)  BP goal: <130/39mHg  Family History: The patient's family history includes Diabetes in her mother; HIV in her brother; Heart disease in her father; Hyperlipidemia in her mother; Hypertension in her brother, brother, mother, and another family member; Stroke in her mother. There is no history of Colon cancer, Colon polyps, Esophageal cancer, Rectal cancer, or Stomach cancer.  Social History: 1 glass a wine a week, never smoked  Diet: fasts 1 or 2 days a week, limits sugar  Exercise: jogs, does high intensity  training 5 days a week  Wt Readings from Last 3 Encounters:  01/29/21 130 lb (59 kg)  01/20/21 146 lb (66.2 kg)  12/30/20 146 lb (66.2 kg)   BP Readings from Last 3 Encounters:  03/26/21 122/84  02/13/21 116/80  01/29/21 128/74   Pulse Readings from Last 3 Encounters:  03/26/21 (!) 57  02/13/21 67  01/29/21 (!) 55    Renal function: CrCl cannot be calculated (Patient's most recent lab result is older than the maximum 21 days allowed.).  Past Medical History:  Diagnosis Date   Hypertension    Mitral regurgitation    Echocardiogram 2/22: EF 40-45, freq PVCs, mod conc LVH, mod to severe MR (tethering of post MV leaflet w resultant eccentric laterally directed MR), Gr 2 DD, normal RVSF, mod BAE, trivial pericardial effusion, trivial AI, mod dilation of ascending aorta (43 mm)   PVC's (premature ventricular contractions)    ZIO monitor 3/22: Sinus rhythm, PVC burden 9%, one 5 beat run NSVT    Current Outpatient Medications on File Prior to Visit  Medication Sig Dispense Refill   amLODipine (NORVASC) 5 MG tablet Take 1 tablet (5 mg total) by mouth daily. 30 tablet 5   Ascorbic Acid (VITAMIN C WITH ROSE HIPS) 500 MG tablet Take 500 mg by mouth daily.     BLACK CURRANT SEED OIL PO Take by mouth daily.  Calcium Carbonate-Vit D-Min (CALCIUM 1200 PO) Take 1 tablet by mouth daily.     carvedilol (COREG) 3.125 MG tablet Take 1 tablet (3.125 mg total) by mouth 2 (two) times daily. 180 tablet 3   dapagliflozin propanediol (FARXIGA) 10 MG TABS tablet Take 1 tablet (10 mg total) by mouth daily before breakfast. 30 tablet 5   GARLIC PO Take 1 tablet by mouth daily.     Melatonin 5 MG CAPS Take 5 mg by mouth at bedtime.     Multiple Vitamins-Minerals (MULTIVITAMIN WITH MINERALS) tablet Take 1 tablet by mouth daily.     Potassium 99 MG TABS Take 1 tablet by mouth daily.     vitamin B-12 (CYANOCOBALAMIN) 1000 MCG tablet Take 1,000 mcg by mouth daily.     vitamin E 45 MG (100 UNITS) capsule  Take 100 Units by mouth daily.     No current facility-administered medications on file prior to visit.    No Known Allergies   Assessment/Plan:  1. CHF with LVEF 50% - BP close to goal <130/17mHg today although diastolic is running higher than normal. Rechecking BMET today. If stable, will stop amlodipine and rechallenge with low dose Entresto. Otherwise, can consider valsartan instead. Pt to continue on carvedilol 3.'125mg'$  BID (dose titration limited by HR in the 50-60s) and Farxiga '10mg'$  daily.   Jodi Hanson, PharmD, BCACP, [id number]. [address] [place] [address]Phone: [phone] Fax: [phone]/24/2022 7:24 AM

## 2021-05-22 ENCOUNTER — Telehealth: Payer: Self-pay | Admitting: Pharmacist

## 2021-05-22 DIAGNOSIS — I5020 Unspecified systolic (congestive) heart failure: Secondary | ICD-10-CM

## 2021-05-22 NOTE — Telephone Encounter (Addendum)
BMET stable. Will stop amlodipine '5mg'$  and rechallenge with low dose Entresto 24-'26mg'$  BID (pt still has 24 tabs left in her bottle from when she took previously). Will keep a close eye on electrolytes and recheck BMET in 1 week. She will continue on Farxiga and carvedilol. Pt aware of lab results and med plan.

## 2021-05-22 NOTE — Addendum Note (Signed)
Addended by: Renie Stelmach E on: 05/22/2021 11:07 AM   Modules accepted: Orders

## 2021-05-27 ENCOUNTER — Encounter: Payer: Self-pay | Admitting: Family

## 2021-05-29 ENCOUNTER — Other Ambulatory Visit: Payer: 59

## 2021-05-29 ENCOUNTER — Other Ambulatory Visit: Payer: Self-pay

## 2021-05-29 DIAGNOSIS — I5020 Unspecified systolic (congestive) heart failure: Secondary | ICD-10-CM

## 2021-05-30 ENCOUNTER — Telehealth: Payer: Self-pay | Admitting: Pharmacist

## 2021-05-30 LAB — BASIC METABOLIC PANEL
BUN/Creatinine Ratio: 12 (ref 9–23)
BUN: 12 mg/dL (ref 6–24)
CO2: 24 mmol/L (ref 20–29)
Calcium: 9.3 mg/dL (ref 8.7–10.2)
Chloride: 101 mmol/L (ref 96–106)
Creatinine, Ser: 1.03 mg/dL — ABNORMAL HIGH (ref 0.57–1.00)
Glucose: 132 mg/dL — ABNORMAL HIGH (ref 65–99)
Potassium: 4 mmol/L (ref 3.5–5.2)
Sodium: 140 mmol/L (ref 134–144)
eGFR: 65 mL/min/{1.73_m2} (ref >59)

## 2021-05-30 NOTE — Telephone Encounter (Addendum)
BMET stable with Entresto rechallenge. Called pt and left message to assess home BP readings.

## 2021-06-03 MED ORDER — ENTRESTO 24-26 MG PO TABS: 1.0000 | ORAL_TABLET | Freq: Two times a day (BID) | ORAL | 11 refills | Status: DC

## 2021-06-03 NOTE — Addendum Note (Signed)
Addended by: Brinlynn Gorton E on: 06/03/2021 09:36 AM   Modules accepted: Orders

## 2021-06-03 NOTE — Telephone Encounter (Signed)
Called pt back and discussed lab results. Home BP readings have been very well controlled since restarting Entresto, now 110/80. Will continue current meds and pt will call with any concerns.

## 2021-08-26 ENCOUNTER — Encounter: Payer: Self-pay | Admitting: Cardiovascular Disease

## 2021-09-03 IMAGING — MR MR CARD MORPHOLOGY WO/W CM
45 of 48 series · 45 of 48 positions shown · IV contrast (gadavist)
Comparison: none

CLINICAL DATA: Cardiomyopathy of uncertain etiology.

EXAM:
CARDIAC MRI
TECHNIQUE: The patient was scanned on a 1.5 Tesla GE magnet. A dedicated
cardiac coil was used. Functional imaging was done using Fiesta
sequences. [DATE], and 4 chamber views were done to assess for RWMA's.
Modified Yasumi rule using a short axis stack was used to
calculate an ejection fraction on a dedicated work station using
Circle software. The patient received 8 cc of Gadavist. After 10
minutes inversion recovery sequences were used to assess for
infiltration and scar tissue.

[Series 4: [id number] · axial · 8.0mm · 1.41mm/px · 1 of 16 slices shown]
[im 1/16]
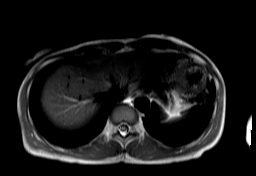

[Series 9: bSSFP · oblique · 8.0mm · 1.61mm/px · 1 of 25 slices shown (1 of 21)]
[im 1/25]
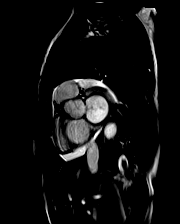

[Series 10: bSSFP · oblique · 8.0mm · 1.61mm/px · 1 of 30 slices shown (2 of 21)]
[im 1/30]
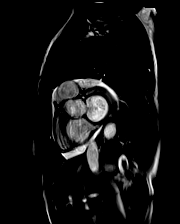

[Series 11: bSSFP · oblique · 8.0mm · 1.61mm/px · 1 of 25 slices shown (3 of 21)]
[im 1/25]
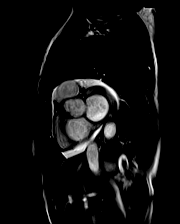

[Series 12: bSSFP · oblique · 8.0mm · 1.61mm/px · 1 of 25 slices shown (4 of 21)]
[im 1/25]
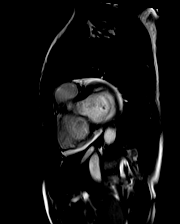

[Series 13: bSSFP · oblique · 8.0mm · 1.61mm/px · 1 of 25 slices shown (5 of 21)]
[im 1/25]
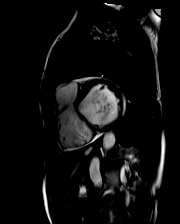

[Series 14: bSSFP · oblique · 8.0mm · 1.61mm/px · 1 of 25 slices shown (6 of 21)]
[im 1/25]
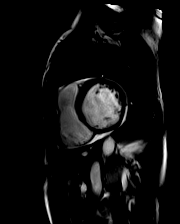

[Series 15: bSSFP · oblique · 8.0mm · 1.61mm/px · 1 of 25 slices shown (7 of 21)]
[im 1/25]
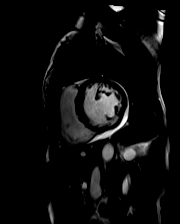

[Series 16: bSSFP · oblique · 8.0mm · 1.61mm/px · 1 of 25 slices shown (8 of 21)]
[im 1/25]
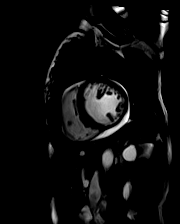

[Series 17: bSSFP · oblique · 8.0mm · 1.61mm/px · 1 of 25 slices shown (9 of 21)]
[im 1/25]
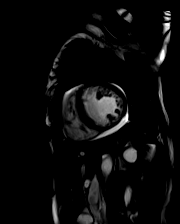

[Series 18: bSSFP · oblique · 8.0mm · 1.61mm/px · 1 of 25 slices shown (10 of 21)]
[im 1/25]
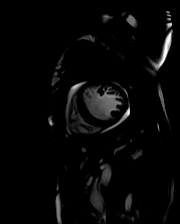

[Series 19: bSSFP · oblique · 8.0mm · 1.61mm/px · 1 of 25 slices shown (11 of 21)]
[im 1/25]
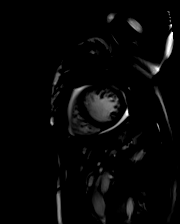

[Series 20: bSSFP · oblique · 8.0mm · 1.61mm/px · 1 of 25 slices shown (12 of 21)]
[im 1/25]
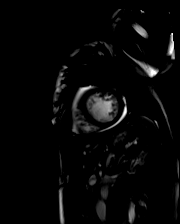

[Series 21: bSSFP · oblique · 8.0mm · 1.61mm/px · 1 of 25 slices shown (13 of 21)]
[im 1/25]
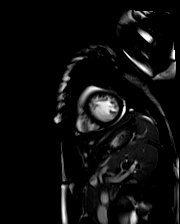

[Series 22: bSSFP · oblique · 8.0mm · 1.61mm/px · 1 of 25 slices shown (14 of 21)]
[im 1/25]
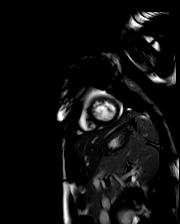

[Series 23: bSSFP · oblique · 8.0mm · 1.61mm/px · 1 of 25 slices shown (15 of 21)]
[im 1/25]
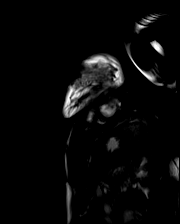

[Series 24: bSSFP · oblique · 8.0mm · 1.61mm/px · 1 of 25 slices shown (16 of 21)]
[im 1/25]
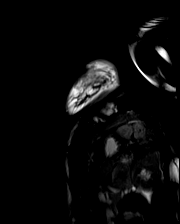

[Series 25: bSSFP · oblique · 8.0mm · 1.61mm/px · 1 of 25 slices shown (17 of 21)]
[im 1/25]
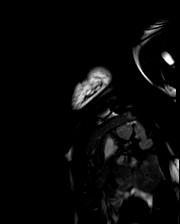

[Series 26: bSSFP · oblique · 8.0mm · 1.61mm/px · 1 of 25 slices shown (18 of 21)]
[im 1/25]
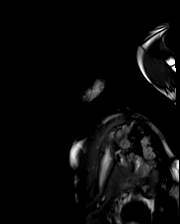

[Series 27: bSSFP · oblique · 6.0mm · 1.41mm/px · 1 of 25 slices shown (19 of 21)]
[im 1/25]
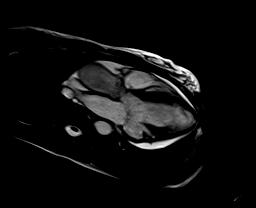

[Series 28: bSSFP · coronal · 6.0mm · 1.41mm/px · 1 of 25 slices shown (20 of 21)]
[im 1/25]
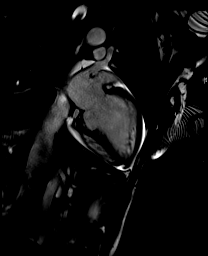

[Series 29: bSSFP · oblique · 6.0mm · 1.41mm/px · 1 of 25 slices shown (21 of 21)]
[im 1/25]
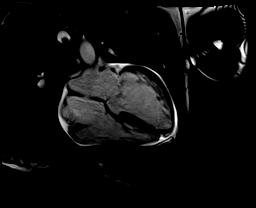

[Series 30: cine_trufi_cs_rt_short axis · oblique · 8.0mm · 1.73mm/px · 1 of 49 slices shown (1 of 20)]
[im 1/49]
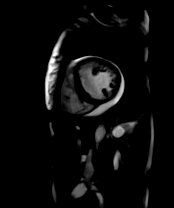

[Series 30: cine_trufi_cs_rt_short axis · oblique · 8.0mm · 1.73mm/px · 1 of 49 slices shown (2 of 20)]
[im 1/49]
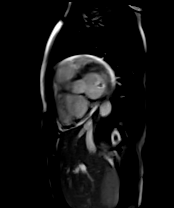

[Series 30: cine_trufi_cs_rt_short axis · oblique · 8.0mm · 1.73mm/px · 1 of 49 slices shown (3 of 20)]
[im 1/49]
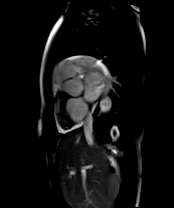

[Series 30: cine_trufi_cs_rt_short axis · oblique · 8.0mm · 1.73mm/px · 1 of 49 slices shown (4 of 20)]
[im 1/49]
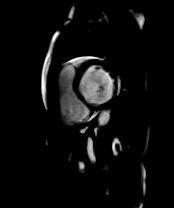

[Series 30: cine_trufi_cs_rt_short axis · oblique · 8.0mm · 1.73mm/px · 1 of 49 slices shown (5 of 20)]
[im 1/49]
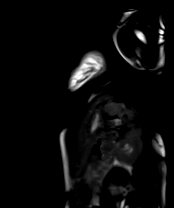

[Series 30: cine_trufi_cs_rt_short axis · oblique · 8.0mm · 1.73mm/px · 1 of 49 slices shown (6 of 20)]
[im 1/49]
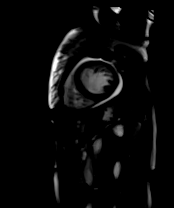

[Series 30: cine_trufi_cs_rt_short axis · oblique · 8.0mm · 1.73mm/px · 1 of 49 slices shown (7 of 20)]
[im 1/49]
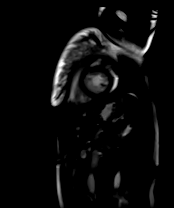

[Series 30: cine_trufi_cs_rt_short axis · oblique · 8.0mm · 1.73mm/px · 1 of 49 slices shown (8 of 20)]
[im 1/49]
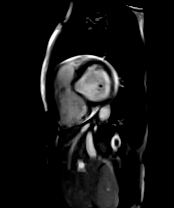

[Series 30: cine_trufi_cs_rt_short axis · oblique · 8.0mm · 1.73mm/px · 1 of 49 slices shown (9 of 20)]
[im 1/49]
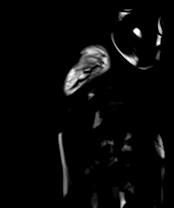

[Series 30: cine_trufi_cs_rt_short axis · oblique · 8.0mm · 1.73mm/px · 1 of 49 slices shown (10 of 20)]
[im 1/49]
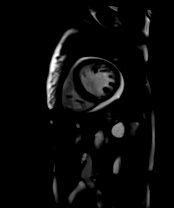

[Series 30: cine_trufi_cs_rt_short axis · oblique · 8.0mm · 1.73mm/px · 1 of 49 slices shown (11 of 20)]
[im 1/49]
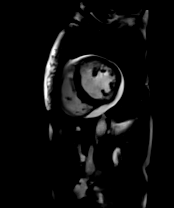

[Series 30: cine_trufi_cs_rt_short axis · oblique · 8.0mm · 1.73mm/px · 1 of 49 slices shown (12 of 20)]
[im 1/49]
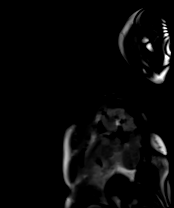

[Series 30: cine_trufi_cs_rt_short axis · oblique · 8.0mm · 1.73mm/px · 1 of 49 slices shown (13 of 20)]
[im 1/49]
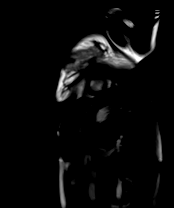

[Series 30: cine_trufi_cs_rt_short axis · oblique · 8.0mm · 1.73mm/px · 1 of 49 slices shown (14 of 20)]
[im 1/49]
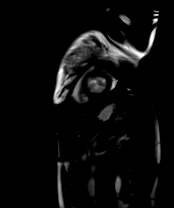

[Series 30: cine_trufi_cs_rt_short axis · oblique · 8.0mm · 1.73mm/px · 1 of 49 slices shown (15 of 20)]
[im 1/49]
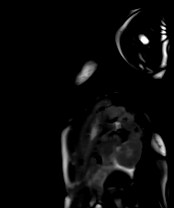

[Series 30: cine_trufi_cs_rt_short axis · oblique · 8.0mm · 1.73mm/px · 1 of 49 slices shown (16 of 20)]
[im 1/49]
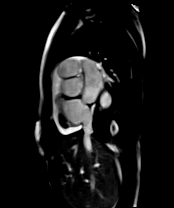

[Series 30: cine_trufi_cs_rt_short axis · oblique · 8.0mm · 1.73mm/px · 1 of 49 slices shown (17 of 20)]
[im 1/49]
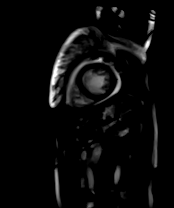

[Series 30: cine_trufi_cs_rt_short axis · oblique · 8.0mm · 1.73mm/px · 1 of 49 slices shown (18 of 20)]
[im 1/49]
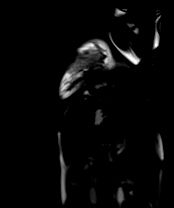

[Series 30: cine_trufi_cs_rt_short axis · oblique · 8.0mm · 1.73mm/px · 1 of 49 slices shown (19 of 20)]
[im 1/49]
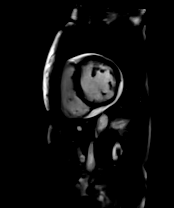

[Series 30: cine_trufi_cs_rt_short axis · oblique · 8.0mm · 1.73mm/px · 1 of 49 slices shown (20 of 20)]
[im 1/49]
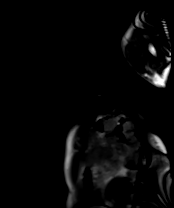

[Series 31: (id)_long_t1 · oblique · 8.0mm · 1.56mm/px · 1 of 24 slices shown]
[im 1/24]
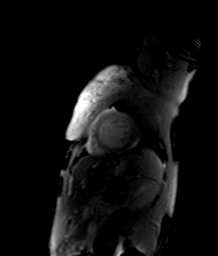

[Series 32: (id)_long_t1_moco · oblique · 8.0mm · 1.56mm/px · 1 of 24 slices shown]
[im 1/24]
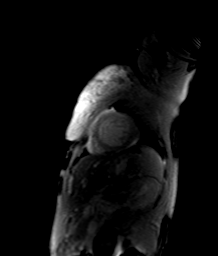

[Series 33: (id)_long_t1_moco_t1 · oblique · 8.0mm · 1.56mm/px · 1 of 5 slices shown]
[im 1/5]
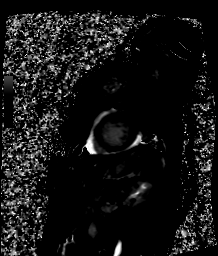

[45 of 48 positions shown; findings below may reference images not displayed]

FINDINGS: Limited images of the lung fields showed no gross abnormalities.

Small circumferential pericardial effusion. Normal left ventricular
size with mild LV hypertrophy. Basal to mid inferolateral and
inferior akinesis, EF 50%. Normal right ventricular size and
systolic function, EF 50%. Moderate left atrial enlargement. Mild
right atrial enlargement. Trileaflet aortic valve with no
significant stenosis or regurgitation noted. Restriction of
posterior leaflet with moderate mitral regurgitation (regurgitant
volume 22%).

On delayed enhancement imaging, there was >50% wall thickness
subendocardial late gadolinium enhancement (LGE) in the basal to mid
inferior and inferolateral walls.

Measurements:

LVEDV 238 mL

LVSV 118 mL
LVEF 50%

Aortic forward volume 92 mL

RVEDV 212 mL
RVSV 106 mL
RVEF 50%
IMPRESSION: 1. Normal LV size with mild LV hypertrophy. EF 50% with basal to mid
inferior and inferolateral akinesis.

2.  Normal RV size and systolic function, EF 50%.

3. Moderate mitral regurgitation with regurgitant fraction
calculated 22%. Restricted posterior leaflet, suspect
infarct-related MR.

4. Delayed enhancement pattern suggests prior myocardial infarction
in the basal to mid inferior and inferolateral walls.

Cathy Kyaw

## 2021-09-10 ENCOUNTER — Other Ambulatory Visit: Payer: Self-pay | Admitting: Cardiovascular Disease

## 2021-09-10 MED ORDER — DAPAGLIFLOZIN PROPANEDIOL 10 MG PO TABS: 10.0000 mg | ORAL_TABLET | Freq: Every day | ORAL | 4 refills | Status: DC

## 2021-12-04 DIAGNOSIS — I493 Ventricular premature depolarization: Secondary | ICD-10-CM | POA: Insufficient documentation

## 2021-12-04 NOTE — Progress Notes (Signed)
Cardiology Office Note:    Date:  12/05/2021   ID:  Jodi Hanson, DOB [birth year], MRN [id number]  PCP:  Debbrah Alar, NP  [facility] Providers Cardiologist:  Sherren Mocha, MD    Referring MD: Debbrah Alar, NP   Chief Complaint:  F/u CHF, MR    Patient Profile: HFmrEF (heart failure with mildly reduced ejection fraction)  Echocardiogram 2/22: EF 40-45 CMR 5/22: EF 50 Hypertension  Mitral regurgitation  Echocardiogram 2/22: mod to severe CMR 5/22: moderate  Moderate Stage C Hx of Fen/Phen use in 1990s PVCs Monitor 2/22: 9% Dilated ascending aorta Echocardiogram 2/22:  43 mm   Prior CV Studies: Cardiac MRI 02/21/21 Mild LVH, EF 50, inf and inf-lat AK, normal RVSF, mod MR (restricted post leaflet - ?infarct related MR), LGE c/w inf and inf-lat infarct   Zio 10/29/20 NSR, freq PVCs (9%), one 5 beat ventricular run; no high grade HB or sustained ventricular arrhythmias   Echocardiogram 11/20/20 EF 40-45, global HK, mod conc LVH, mod to severe MR, Gr 2 DD, normal RVSF, normal PASP, mod BAE, trivial pericardial effusion, trivial AI, mod dilation of ascending aorta (43 mm)  Echocardiogram 11/23/2018 EF 50-55, frequent PVCs, severe conc LVH, Gr 3 DD (restrictive filling) normal RVSF, mild LAE, mod RAE, small pericardial effusion, mod to severe MR, mild to mod TR, Asc Ao 43 mm (dilated)   History of Present Illness:   Jodi Hanson is a 56 y.o. female with the above problem list.  She was last seen by Dr. Burt Knack in 4/22.  She had f/u with the PharmD clinic for titration of CHF meds.  She did not tolerate Entresto at first with low Na.  But, at last visit in 8/22, she was started back on Entresto with stable renal function.  She returns for f/u.  She is here alone.  She is very active and runs often.  She had to start taking her medications after her runs because of lightheadedness.  Otherwise, she has not had syncope, orthopnea, leg edema.  She does get short of  breath sometimes when she runs.  She has not had chest pain.  She is interested in undergoing stress testing to help guide her exercise.    Past Medical History:  Diagnosis Date   Hypertension    Mitral regurgitation    Echocardiogram 2/22: EF 40-45, freq PVCs, mod conc LVH, mod to severe MR (tethering of post MV leaflet w resultant eccentric laterally directed MR), Gr 2 DD, normal RVSF, mod BAE, trivial pericardial effusion, trivial AI, mod dilation of ascending aorta (43 mm)   PVC's (premature ventricular contractions)    ZIO monitor 3/22: Sinus rhythm, PVC burden 9%, one 5 beat run NSVT   Current Medications: Current Meds  Medication Sig   Ascorbic Acid (VITAMIN C WITH ROSE HIPS) 500 MG tablet Take 500 mg by mouth daily.   BLACK CURRANT SEED OIL PO Take by mouth daily.   Calcium Carbonate-Vit D-Min (CALCIUM 1200 PO) Take 1 tablet by mouth daily.   GARLIC PO Take 1 tablet by mouth daily.   Melatonin 5 MG CAPS Take 5 mg by mouth at bedtime.   Multiple Vitamins-Minerals (MULTIVITAMIN WITH MINERALS) tablet Take 1 tablet by mouth daily.   Potassium 99 MG TABS Take 1 tablet by mouth daily.   vitamin B-12 (CYANOCOBALAMIN) 1000 MCG tablet Take 1,000 mcg by mouth daily.   vitamin E 45 MG (100 UNITS) capsule Take 100 Units by mouth daily.   [DISCONTINUED] carvedilol (  COREG) 3.125 MG tablet Take 1 tablet (3.125 mg total) by mouth 2 (two) times daily.   [DISCONTINUED] dapagliflozin propanediol (FARXIGA) 10 MG TABS tablet Take 1 tablet (10 mg total) by mouth daily before breakfast.   [DISCONTINUED] sacubitril-valsartan (ENTRESTO) 24-26 MG Take 1 tablet by mouth 2 (two) times daily.    Allergies:   Patient has no known allergies.   Social History   Tobacco Use   Smoking status: Never   Smokeless tobacco: Never  Substance Use Topics   Alcohol use: Yes    Alcohol/week: 3.0 standard drinks    Types: 3 Glasses of wine per week   Drug use: Never    Family Hx: The patient's family history  includes Diabetes in her mother; HIV in her brother; Heart disease in her father; Hyperlipidemia in her mother; Hypertension in her brother, brother, mother, and another family member; Stroke in her mother. There is no history of Colon cancer, Colon polyps, Esophageal cancer, Rectal cancer, or Stomach cancer.  Review of Systems  Constitutional: Positive for malaise/fatigue.  Respiratory:  Positive for snoring.     EKGs/Labs/Other Test Reviewed:    EKG:  EKG is notn ordered today.  The ekg ordered today demonstrates /a  Recent Labs: 01/29/2021: ALT 19; Hemoglobin 12.7; Platelets 277.0 05/29/2021: BUN 12; Creatinine, Ser 1.03; Potassium 4.0; Sodium 140   Recent Lipid Panel Recent Labs    01/29/21 0733  CHOL 227*  TRIG 83.0  HDL 62.60  VLDL 16.6  LDLCALC 148*     Risk Assessment/Calculations:     STOP-Bang Score:  4      Physical Exam:    VS:  BP 116/76 (BP Location: Right Arm, Patient Position: Sitting, Cuff Size: Normal)    Pulse 68    Ht '5\' 4"'$  (1.626 m)    Wt 135 lb 6.4 oz (61.4 kg)    BMI 23.24 kg/m     Wt Readings from Last 3 Encounters:  12/05/21 135 lb 6.4 oz (61.4 kg)  05/21/21 136 lb (61.7 kg)  01/29/21 130 lb (59 kg)    Constitutional:      Appearance: Healthy appearance. Not in distress.  Neck:     Vascular: No JVR. JVD normal.  Pulmonary:     Effort: Pulmonary effort is normal.     Breath sounds: No wheezing. No rales.  Cardiovascular:     Normal rate. Regular rhythm. Normal S1. Widely split S2.      Murmurs: There is no murmur.  Edema:    Peripheral edema absent.  Abdominal:     Palpations: Abdomen is soft.  Skin:    General: Skin is warm and dry.  Neurological:     Mental Status: Alert and oriented to person, place and time.     Cranial Nerves: Cranial nerves are intact.        ASSESSMENT & PLAN:   HFmrEF (heart failure with mildly reduced ejection fraction) (HCC) EF 50 on MRI and 40-45 on echocardiogram.  She is now on SGLT2 inhibitor,  beta-blocker, Entresto.  I am not certain that her blood pressure would tolerate the addition of spironolactone.  Clinically she is doing well with NYHA I-II symptoms.  No signs or symptoms of volume excess.  Continue current therapy.  Plan follow-up echo in June.  Follow Dr. Burt Knack in 6 months.  Moderate to severe mitral regurgitation Moderate MR by MRI.  Clinically doing well without symptoms to suggest worsening.  Plan follow-up echo in June.  Frequent PVCs Continue beta-blocker.  Essential hypertension Blood pressure controlled.  Continue Entresto 24/26 mg twice daily, carvedilol 3.125 mg twice daily.  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  Plan follow-up echo in June.  Shortness of breath She has experiencing shortness of breath during running.  This may be just related to normal activity.  She would like to pursue stress testing to ensure that she is safe with exercise.  Arrange exercise treadmill test.  Sleep-disordered breathing She has a history of snoring as well as several family members with sleep apnea.  She also notes daytime hypersomnolence.  STOP-Bang Score:  4     Arrange home sleep study      Shared Decision Making/Informed Consent The risks [chest pain, shortness of breath, cardiac arrhythmias, dizziness, blood pressure fluctuations, myocardial infarction, stroke/transient ischemic attack, and life-threatening complications (estimated to be 1 in 10,000)], benefits (risk stratification, diagnosing coronary artery disease, treatment guidance) and alternatives of an exercise tolerance test were discussed in detail with Jodi Hanson and she agrees to proceed.  Dispo:  Return in about 6 months (around 06/07/2022) for Routine Follow Up, w/ Dr. Burt Knack.   Medication Adjustments/Labs and Tests Ordered: Current medicines are reviewed at length with the patient today.  Concerns regarding medicines are outlined above.  Tests Ordered: Orders Placed This Encounter   Procedures   Exercise Tolerance Test   ECHOCARDIOGRAM COMPLETE   Itamar Sleep Study   Medication Changes: Meds ordered this encounter  Medications   carvedilol (COREG) 3.125 MG tablet    Sig: Take 1 tablet (3.125 mg total) by mouth 2 (two) times daily.    Dispense:  180 tablet    Refill:  3   dapagliflozin propanediol (FARXIGA) 10 MG TABS tablet    Sig: Take 1 tablet (10 mg total) by mouth daily before breakfast.    Dispense:  90 tablet    Refill:  3   sacubitril-valsartan (ENTRESTO) 24-26 MG    Sig: Take 1 tablet by mouth 2 (two) times daily.    Dispense:  180 tablet    Refill:  3   Signed, Richardson Dopp, PA-C  12/05/2021 8:53 AM    [facility] [address], [place], [place]  [address] Phone: [phone]; Fax: [phone]

## 2021-12-05 ENCOUNTER — Ambulatory Visit: Payer: 59 | Admitting: Physician Assistant

## 2021-12-05 ENCOUNTER — Other Ambulatory Visit: Payer: Self-pay

## 2021-12-05 ENCOUNTER — Telehealth: Payer: Self-pay | Admitting: *Deleted

## 2021-12-05 ENCOUNTER — Encounter: Payer: Self-pay | Admitting: Physician Assistant

## 2021-12-05 VITALS — BP 116/76 | HR 68 | Ht 64.0 in | Wt 135.4 lb

## 2021-12-05 DIAGNOSIS — I10 Essential (primary) hypertension: Secondary | ICD-10-CM | POA: Diagnosis not present

## 2021-12-05 DIAGNOSIS — I5020 Unspecified systolic (congestive) heart failure: Secondary | ICD-10-CM | POA: Diagnosis not present

## 2021-12-05 DIAGNOSIS — I493 Ventricular premature depolarization: Secondary | ICD-10-CM | POA: Diagnosis not present

## 2021-12-05 DIAGNOSIS — R0683 Snoring: Secondary | ICD-10-CM

## 2021-12-05 DIAGNOSIS — I340 Nonrheumatic mitral (valve) insufficiency: Secondary | ICD-10-CM | POA: Diagnosis not present

## 2021-12-05 DIAGNOSIS — R0602 Shortness of breath: Secondary | ICD-10-CM

## 2021-12-05 DIAGNOSIS — G4730 Sleep apnea, unspecified: Secondary | ICD-10-CM | POA: Insufficient documentation

## 2021-12-05 DIAGNOSIS — I77810 Thoracic aortic ectasia: Secondary | ICD-10-CM

## 2021-12-05 MED ORDER — ENTRESTO 24-26 MG PO TABS: 1.0000 | ORAL_TABLET | Freq: Two times a day (BID) | ORAL | 3 refills | Status: AC

## 2021-12-05 MED ORDER — DAPAGLIFLOZIN PROPANEDIOL 10 MG PO TABS: 10.0000 mg | ORAL_TABLET | Freq: Every day | ORAL | 3 refills | Status: DC

## 2021-12-05 MED ORDER — CARVEDILOL 3.125 MG PO TABS: 3.1250 mg | ORAL_TABLET | Freq: Two times a day (BID) | ORAL | 3 refills | Status: DC

## 2021-12-05 NOTE — Assessment & Plan Note (Signed)
EF 50 on MRI and 40-45 on echocardiogram.  She is now on SGLT2 inhibitor, beta-blocker, Entresto.  I am not certain that her blood pressure would tolerate the addition of spironolactone.  Clinically she is doing well with NYHA I-II symptoms.  No signs or symptoms of volume excess.  Continue current therapy.  Plan follow-up echo in June.  Follow Dr. Burt Knack in 6 months. ?

## 2021-12-05 NOTE — Assessment & Plan Note (Signed)
Continue beta blocker. 

## 2021-12-05 NOTE — Assessment & Plan Note (Signed)
Blood pressure controlled.  Continue Entresto 24/26 mg twice daily, carvedilol 3.125 mg twice daily. ?

## 2021-12-05 NOTE — Assessment & Plan Note (Signed)
She has a history of snoring as well as several family members with sleep apnea.  She also notes daytime hypersomnolence.  STOP-Bang Score:  4     Arrange home sleep study ?

## 2021-12-05 NOTE — Assessment & Plan Note (Signed)
43 mm by echo in 2/22.  Plan follow-up echo in June. ?

## 2021-12-05 NOTE — Telephone Encounter (Signed)
Set up 12/05/21 ?

## 2021-12-05 NOTE — Telephone Encounter (Signed)
Pt has been set up with Itamar study, ordered per Richardson Dopp, PAC. I have gone over the tutorial with the pt with verbal understanding. Pt aware to not open the box until we have called her with a PIN#. If denied or not done in 30 days from time of approval or denial pt will be charged $100. Pt thanked me for the help today.  ?

## 2021-12-05 NOTE — Assessment & Plan Note (Signed)
She has experiencing shortness of breath during running.  This may be just related to normal activity.  She would like to pursue stress testing to ensure that she is safe with exercise.  Arrange exercise treadmill test. ?

## 2021-12-05 NOTE — Assessment & Plan Note (Signed)
Moderate MR by MRI.  Clinically doing well without symptoms to suggest worsening.  Plan follow-up echo in June. ?

## 2021-12-05 NOTE — Patient Instructions (Signed)
Medication Instructions:  ? ?Your physician recommends that you continue on your current medications as directed. Please refer to the Current Medication list given to you today. ? ? ?*If you need a refill on your cardiac medications before your next appointment, please call your pharmacy* ? ?Testing/Procedures: ? ?Your physician has requested that you have an exercise tolerance test. For further information please visit [email or web address]. Please also follow instruction sheet, as given. ? ?Your physician has requested that you have an echocardiogram. Echocardiography is a painless test that uses sound waves to create images of your heart. It provides your doctor with information about the size and shape of your heart and how well your heart?s chambers and valves are working. This procedure takes approximately one hour. There are no restrictions for this procedure. ? ?Your physician has recommended that you have a sleep study. This test records several body functions during sleep, including: brain activity, eye movement, oxygen and carbon dioxide blood levels, heart rate and rhythm, breathing rate and rhythm, the flow of air through your mouth and nose, snoring, body muscle movements, and chest and belly movement. ? ? ? ?Follow-Up: ?At [facility], you and your health needs are our priority.  As part of our continuing mission to provide you with exceptional heart care, we have created designated Provider Care Teams.  These Care Teams include your primary Cardiologist (physician) and Advanced Practice Providers (APPs -  Physician Assistants and Nurse Practitioners) who all work together to provide you with the care you need, when you need it. ? ?We recommend signing up for the patient portal called "MyChart".  Sign up information is provided on this After Visit Summary.  MyChart is used to connect with patients for Virtual Visits (Telemedicine).  Patients are able to view lab/test results, encounter notes,  upcoming appointments, etc.  Non-urgent messages can be sent to your provider as well.   ?To learn more about what you can do with MyChart, go to [email or web address].   ? ?Your next appointment:   ?6 month(s) ? ?The format for your next appointment:   ?In Person ? ?Provider:   ?Sherren Mocha, MD   ? ? ?Other Instructions ? ?Your physician wants you to follow-up in: 6 months with Dr.Cooper.  You will receive a reminder letter in the mail two months in advance. If you don't receive a letter, please call our office to schedule the follow-up appointment. ?  ?

## 2021-12-09 ENCOUNTER — Other Ambulatory Visit: Payer: Self-pay | Admitting: *Deleted

## 2021-12-09 NOTE — Addendum Note (Signed)
Addended byKathlen Mody, Nicki Reaper T on: 12/09/2021 12:32 PM ? ? Modules accepted: Orders ? ?

## 2021-12-16 NOTE — Telephone Encounter (Signed)
Prior Authorization for [employer] sent to [employer]. NO PA REQUIRED ?

## 2021-12-17 NOTE — Telephone Encounter (Signed)
Left message for the pt to call the office and ask to s/w Arbie Cookey for the PIN# for Itamar sleep study.  ?

## 2021-12-18 NOTE — Telephone Encounter (Signed)
Called and made the patient aware that she may proceed with the [facility] Sleep Study. PIN # provided to the patient. Patient made aware that she will be contacted after the test has been read with the results and any recommendations. Patient verbalized understanding and thanked me for the call.  ? ? ?Pt states she will do sleep study this weekend. Pt thanked me for the call today.  ?

## 2021-12-25 ENCOUNTER — Encounter: Payer: Self-pay | Admitting: Physician Assistant

## 2021-12-25 ENCOUNTER — Ambulatory Visit (INDEPENDENT_AMBULATORY_CARE_PROVIDER_SITE_OTHER): Payer: 59

## 2021-12-25 DIAGNOSIS — I10 Essential (primary) hypertension: Secondary | ICD-10-CM

## 2021-12-25 DIAGNOSIS — R0683 Snoring: Secondary | ICD-10-CM

## 2021-12-25 DIAGNOSIS — I493 Ventricular premature depolarization: Secondary | ICD-10-CM

## 2021-12-25 DIAGNOSIS — I340 Nonrheumatic mitral (valve) insufficiency: Secondary | ICD-10-CM | POA: Diagnosis not present

## 2021-12-25 DIAGNOSIS — I5020 Unspecified systolic (congestive) heart failure: Secondary | ICD-10-CM

## 2021-12-25 LAB — EXERCISE TOLERANCE TEST
Angina Index: 0
Duke Treadmill Score: 12
Estimated workload: 13.4
Exercise duration (min): 12 min
Exercise duration (sec): 0 s
MPHR: 167 {beats}/min
Peak HR: 153 {beats}/min
Percent HR: 91 %
RPE: 15
Rest HR: 60 {beats}/min
ST Depression (mm): 0 mm

## 2022-02-19 ENCOUNTER — Encounter (INDEPENDENT_AMBULATORY_CARE_PROVIDER_SITE_OTHER): Payer: 59 | Admitting: Cardiology

## 2022-02-19 DIAGNOSIS — I5020 Unspecified systolic (congestive) heart failure: Secondary | ICD-10-CM | POA: Diagnosis not present

## 2022-02-23 NOTE — Procedures (Signed)
   SLEEP STUDY REPORT Patient Information Study Date: 02/19/22 Patient Name: Jodi Hanson Patient ID: [id number] Birth Date: [birth year] Age: 56 Gender: Female BMI: 23.0 (W=134 lb, H=5' 4'') Referring Physician: Richardson Dopp, PA  TEST DESCRIPTION: Home sleep apnea testing was completed using the WatchPat, a Type 1 device, utilizing  peripheral arterial tonometry (PAT), chest movement, actigraphy, pulse oximetry, pulse rate, body position and snore.  AHI was calculated with apnea and hypopnea using valid sleep time as the denominator. RDI includes apneas,  hypopneas, and RERAs. The data acquired and the scoring of sleep and all associated events were performed in  accordance with the recommended standards and specifications as outlined in the AASM Manual for the Scoring of  Sleep and Associated Events 2.2.0 (2015).   FINDINGS: 1. No evidence of Obstructive Sleep Apnea with AHI 2.2/hr.  2. No Central Sleep Apnea. 3. Oxygen desaturations as low as 92%. 4. Minimal snoring was present. O2 sats were < 88% for 0 minutes. 5. Total sleep time was 6 hrs and 28 min. 6. 29.1% of total sleep time was spent in REM sleep.  7. Shortened sleep onset latency at 6 min.  8. Shortened REM sleep onset latency at 49 min.  9. Total awakenings were 5.   DIAGNOSIS:  Normal study with no significant sleep disordered breathing.  RECOMMENDATIONS: 1. Normal study with no significant sleep disordered breathing. 2. Healthy sleep recommendations include: adequate nightly sleep (normal 7-9 hrs/night), avoidance of caffeine after  noon and alcohol near bedtime, and maintaining a sleep environment that is cool, dark and quiet. 3. Weight loss for overweight patients is recommended.  4. Snoring recommendations include: weight loss where appropriate, side sleeping, and avoidance of alcohol before  bed. 5. Operation of motor vehicle or dangerous equipment must be avoided when feeling drowsy, excessively sleepy, or   mentally fatigued.  6. An ENT consultation which may be useful for specific causes of and possible treatment of bothersome snoring .  7. Weight loss may be of benefit in reducing the severity of snoring.   Signature: Electronically Signed: 02/23/22 Fransico Him, MD; [facility]; [place], American Board of Sleep Medicine

## 2022-03-03 ENCOUNTER — Telehealth: Payer: Self-pay | Admitting: *Deleted

## 2022-03-03 NOTE — Telephone Encounter (Signed)
-----   Message from Lauralee Evener, [place] sent at 02/24/2022  8:17 AM EDT -----  ----- Message ----- From: Sueanne Margarita, MD Sent: 02/23/2022   2:29 PM EDT To: [facility]  Normal home sleep study so in lab PSG will be ordered

## 2022-03-03 NOTE — Telephone Encounter (Signed)
The patient has been notified of the result and verbalized understanding.  All questions (if any) were answered. Marolyn Hammock, [place] 03/03/2022 1:41 PM   Patient has declined more testing. Pt is aware and agreeable to normal results.

## 2022-03-11 ENCOUNTER — Ambulatory Visit (HOSPITAL_COMMUNITY): Payer: 59 | Attending: Internal Medicine

## 2022-03-11 DIAGNOSIS — I340 Nonrheumatic mitral (valve) insufficiency: Secondary | ICD-10-CM | POA: Insufficient documentation

## 2022-03-11 DIAGNOSIS — I5020 Unspecified systolic (congestive) heart failure: Secondary | ICD-10-CM | POA: Insufficient documentation

## 2022-03-11 DIAGNOSIS — R0683 Snoring: Secondary | ICD-10-CM | POA: Insufficient documentation

## 2022-03-11 DIAGNOSIS — I5021 Acute systolic (congestive) heart failure: Secondary | ICD-10-CM

## 2022-03-11 DIAGNOSIS — I10 Essential (primary) hypertension: Secondary | ICD-10-CM | POA: Diagnosis present

## 2022-03-11 DIAGNOSIS — I493 Ventricular premature depolarization: Secondary | ICD-10-CM | POA: Diagnosis present

## 2022-03-11 LAB — ECHOCARDIOGRAM COMPLETE
Area-P 1/2: 2.93 cm2
P 1/2 time: 1054 msec
S' Lateral: 4.4 cm

## 2022-03-12 ENCOUNTER — Telehealth: Payer: Self-pay

## 2022-03-12 DIAGNOSIS — I77819 Aortic ectasia, unspecified site: Secondary | ICD-10-CM

## 2022-03-12 DIAGNOSIS — I340 Nonrheumatic mitral (valve) insufficiency: Secondary | ICD-10-CM

## 2022-03-12 DIAGNOSIS — I429 Cardiomyopathy, unspecified: Secondary | ICD-10-CM

## 2022-03-12 NOTE — Telephone Encounter (Signed)
-----   Message from Liliane Shi, [place] sent at 03/12/2022  8:35 AM EDT ----- Echocardiogram findings are stable.  EF is unchanged.  Mitral regurgitation is mild to moderate. There is mild dilation of the ascending aorta.   No changes needed.  A f/u MRI/MRA in 1 year would be a good study to keep an eye on EF, MR and aorta size.  PLAN:  -Continue current medications/treatment plan and follow up as scheduled.  -Schedule cardiac MRI, chest/aorta MRA in 1 year/June 2024 (Dx: mitral regurgitation, cardiomyopathy, dilated aorta) -Make sure pt has planned f/u with Dr. Burt Knack in Sept 2023  Richardson Dopp, PA-C    03/12/2022 8:14 AM

## 2022-03-12 NOTE — Telephone Encounter (Signed)
The patient has been notified of the result and verbalized understanding.  All questions (if any) were answered. Michaelyn Barter, RN 03/12/2022 4:58 PM   Will place order for MRI. Patient has follow up with Dr. Burt Knack on 06/16/22.

## 2022-03-13 ENCOUNTER — Other Ambulatory Visit: Payer: Self-pay | Admitting: Physician Assistant

## 2022-03-13 DIAGNOSIS — I77810 Thoracic aortic ectasia: Secondary | ICD-10-CM

## 2022-03-19 ENCOUNTER — Ambulatory Visit: Payer: 59

## 2022-03-19 DIAGNOSIS — I10 Essential (primary) hypertension: Secondary | ICD-10-CM

## 2022-03-19 DIAGNOSIS — I340 Nonrheumatic mitral (valve) insufficiency: Secondary | ICD-10-CM

## 2022-03-19 DIAGNOSIS — R0683 Snoring: Secondary | ICD-10-CM

## 2022-03-19 DIAGNOSIS — I5020 Unspecified systolic (congestive) heart failure: Secondary | ICD-10-CM

## 2022-03-19 DIAGNOSIS — I493 Ventricular premature depolarization: Secondary | ICD-10-CM

## 2022-04-23 ENCOUNTER — Other Ambulatory Visit (HOSPITAL_BASED_OUTPATIENT_CLINIC_OR_DEPARTMENT_OTHER): Payer: Self-pay | Admitting: Family

## 2022-04-23 DIAGNOSIS — Z1231 Encounter for screening mammogram for malignant neoplasm of breast: Secondary | ICD-10-CM

## 2022-05-10 ENCOUNTER — Ambulatory Visit (HOSPITAL_BASED_OUTPATIENT_CLINIC_OR_DEPARTMENT_OTHER)
Admission: RE | Admit: 2022-05-10 | Discharge: 2022-05-10 | Disposition: A | Discharge status: Home / Self Care | Payer: 59 | Source: Ambulatory Visit | Attending: Nurse Practitioner | Admitting: Nurse Practitioner

## 2022-05-10 DIAGNOSIS — Z1231 Encounter for screening mammogram for malignant neoplasm of breast: Secondary | ICD-10-CM | POA: Insufficient documentation

## 2022-06-16 ENCOUNTER — Encounter: Payer: Self-pay | Admitting: Cardiovascular Disease

## 2022-06-16 ENCOUNTER — Ambulatory Visit: Payer: 59 | Attending: Cardiovascular Disease | Admitting: Cardiovascular Disease

## 2022-06-16 VITALS — BP 132/88 | HR 60 | Ht 64.0 in | Wt 140.6 lb

## 2022-06-16 DIAGNOSIS — I5020 Unspecified systolic (congestive) heart failure: Secondary | ICD-10-CM

## 2022-06-16 DIAGNOSIS — I340 Nonrheumatic mitral (valve) insufficiency: Secondary | ICD-10-CM

## 2022-06-16 DIAGNOSIS — E782 Mixed hyperlipidemia: Secondary | ICD-10-CM | POA: Diagnosis not present

## 2022-06-16 DIAGNOSIS — I77819 Aortic ectasia, unspecified site: Secondary | ICD-10-CM | POA: Diagnosis not present

## 2022-06-16 NOTE — Patient Instructions (Signed)
Medication Instructions:  Your physician recommends that you continue on your current medications as directed. Please refer to the Current Medication list given to you today.  *If you need a refill on your cardiac medications before your next appointment, please call your pharmacy*   Lab Work: CMET, Lipids in 28mIf you have labs (blood work) drawn today and your tests are completely normal, you will receive your results only by: [place](if you have MyChart) OR A paper copy in the mail If you have any lab test that is abnormal or we need to change your treatment, we will call you to review the results.   Testing/Procedures: NONE   Follow-Up: [facility] and your [facility] needs are our priority.  As part of our continuing mission to provide you with exceptional heart care, we have created designated Provider Care Teams.  These Care Teams include your primary Cardiologist (physician) and Advanced Practice Providers (APPs -  Physician Assistants and Nurse Practitioners) who all work together to provide you with the care you need, when you need it.  Your next appointment:   6 month(s)  The format for your next appointment:   In Person  Provider:   TNicholes Rough PA-C, EAmbrose Pancoast NP, MChristen Bame NP, or SRichardson Dopp PA-C     Then, MSherren Mocha MD will plan to see you again in 1 year(s).      Important Information About Sugar

## 2022-06-16 NOTE — Progress Notes (Signed)
Cardiology Office Note:    Date:  06/16/2022   ID:  Jodi Hanson, DOB 05/16/1966, MRN [id number]  PCP:  Debbrah Alar, NP   [facility] Providers Cardiologist:  Sherren Mocha, MD     Referring MD: Debbrah Alar, NP   Chief Complaint  Patient presents with   Congestive Heart Failure    History of Present Illness:    Jodi Hanson is a 56 y.o. female with a hx of: HFmrEF (heart failure with mildly reduced ejection fraction)  Echocardiogram 2/22: EF 40-45 CMR 5/22: EF 50 Hypertension  Mitral regurgitation  Echocardiogram 2/22: mod to severe CMR 5/22: moderate  Moderate Stage C Hx of Fen/Phen use in 1990s PVCs Monitor 2/22: 9% Dilated ascending aorta Echocardiogram 2/22:  43 mm  The patient is here alone today.  She is doing well from a cardiac perspective with no chest pain, shortness of breath, or heart palpitations.  She denies leg edema, orthopnea, or PND.  She is compliant with her medications.  She exercises regularly without any significant functional limitation.  She states that she is eating too many sweets but otherwise is following a prudent diet.  Past Medical History:  Diagnosis Date   History of exercise stress test    Exercise tolerance test 11/2021: No ST deviation, excellent exercise capacity (Ex 12', METs 13.4)   Hypertension    Mitral regurgitation    Echocardiogram 2/22: EF 40-45, freq PVCs, mod conc LVH, mod to severe MR (tethering of post MV leaflet w resultant eccentric laterally directed MR), Gr 2 DD, normal RVSF, mod BAE, trivial pericardial effusion, trivial AI, mod dilation of ascending aorta (43 mm)   PVC's (premature ventricular contractions)    ZIO monitor 3/22: Sinus rhythm, PVC burden 9%, one 5 beat run NSVT    Past Surgical History:  Procedure Laterality Date   CESAREAN SECTION     HERNIA REPAIR  2005   WISDOM TOOTH EXTRACTION      Current Medications: Current Meds  Medication Sig   Ascorbic Acid (VITAMIN  C WITH ROSE HIPS) 500 MG tablet Take 500 mg by mouth daily.   Calcium Carbonate-Vit D-Min (CALCIUM 1200 PO) Take 1 tablet by mouth daily.   carvedilol (COREG) 3.125 MG tablet Take 1 tablet (3.125 mg total) by mouth 2 (two) times daily.   dapagliflozin propanediol (FARXIGA) 10 MG TABS tablet Take 1 tablet (10 mg total) by mouth daily before breakfast.   Multiple Vitamins-Minerals (MULTIVITAMIN WITH MINERALS) tablet Take 1 tablet by mouth daily.   sacubitril-valsartan (ENTRESTO) 24-26 MG Take 1 tablet by mouth 2 (two) times daily.   vitamin E 45 MG (100 UNITS) capsule Take 100 Units by mouth daily.     Allergies:   Patient has no known allergies.   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 There is no history of Colon cancer, Colon polyps, Esophageal cancer,  Rectal cancer, or Stomach cancer.  ROS:   Please see the history of present illness.    All other systems reviewed and are negative.  EKGs/Labs/Other Studies Reviewed:    The following studies were reviewed today: Cardiac MRI: IMPRESSION: 1. Normal LV size with mild LV hypertrophy. EF 50% with basal to mid inferior and inferolateral akinesis.   2.  Normal RV size and systolic function, EF 86%.   3. Moderate mitral regurgitation with regurgitant fraction calculated 22%. Restricted posterior leaflet, suspect infarct-related MR.   4. Delayed enhancement pattern suggests prior myocardial infarction in the basal to mid inferior and inferolateral walls.  Echo 03/11/2022: 1. Left ventricular ejection fraction, by estimation, is 40 to 45%. Left  ventricular ejection fraction by PLAX is 43 %. The left ventricle has  mildly decreased function. The left ventricle demonstrates regional wall  motion abnormalities (see scoring  diagram/findings for description). There is mild left ventricular  hypertrophy. Left ventricular diastolic parameters were normal. There is  severe akinesis of the left ventricular, basal-mid inferior wall,  inferoseptal wall and inferolateral wall.   2. Right ventricular systolic function is normal. The right ventricular  size is normal. There is normal pulmonary artery systolic pressure. The  estimated right ventricular systolic pressure is 76.1 mmHg.   3. Left atrial size was severely dilated.   4. Right atrial size was mildly dilated.   5. The pericardial effusion is circumferential. There is no evidence of  cardiac tamponade.   6. The mitral valve is abnormal. Mild to moderate mitral valve  regurgitation.   7. The aortic valve is tricuspid. Aortic valve regurgitation is not  visualized.   8. Aortic dilatation noted. There is borderline dilatation of the aortic  root, measuring 39 mm. There is mild dilatation of the ascending aorta,  measuring 41 mm.   9.  The inferior vena cava is normal in size with greater than 50%  respiratory variability, suggesting right atrial pressure of 3 mmHg.   Comparison(s): No significant change from prior study. 11/20/2020: LVEF  40-45% - global HK reported, but there is a clear inferior and  inferoseptal WMA consistent with likely prior inferior infarct.   GXT 12/25/2021:   No ST deviation was noted.   Exercise capacity was excellent. Patient exercised for 12 min and 0 sec. Maximum HR of 153 bpm. MPHR 91.0 %. Peak METS 13.4 .   Hypertensive blood pressure and normal heart rate response noted during stress. Heart rate recovery was normal.  EKG:  EKG is ordered today.  The ekg ordered today demonstrates NSR with sinus arrhythmia, HR 60 bpm, within normal limits  Recent Labs: No results found for requested labs within last 365 days.  Recent Lipid Panel    Component Value Date/Time   CHOL 227 (H)  01/29/2021 0733   TRIG 83.0 01/29/2021 0733   HDL 62.60 01/29/2021 0733   CHOLHDL 4 01/29/2021 0733   VLDL 16.6 01/29/2021 0733   LDLCALC 148 (H) 01/29/2021 0733     Risk Assessment/Calculations:           STOP-Bang Score:  4       Physical Exam:    VS:  BP 132/88   Pulse 60   Ht '5\' 4"'$  (1.626 m)   Wt 140 lb 9.6 oz (63.8 kg)   SpO2 98%   BMI 24.13 kg/m     Wt Readings from Last 3 Encounters:  06/16/22 140 lb 9.6 oz (63.8 kg)  12/05/21 135 lb 6.4 oz (61.4 kg)  05/21/21 136 lb (61.7 kg)     GEN:  Well nourished, well developed in no acute distress HEENT: Normal NECK: No JVD; No carotid bruits LYMPHATICS: No lymphadenopathy CARDIAC: RRR, no murmurs, rubs, gallops RESPIRATORY:  Clear to auscultation without rales, wheezing or rhonchi  ABDOMEN: Soft, non-tender, non-distended MUSCULOSKELETAL:  No edema; No deformity  SKIN: Warm and dry NEUROLOGIC:  Alert and oriented x 3 PSYCHIATRIC:  Normal affect   ASSESSMENT:    1. HFmrEF (heart failure with mildly reduced ejection fraction)    2.  Moderate mitral regurgitation   3. Aortic dilatation (HCC)   4. Mixed hyperlipidemia    PLAN:    In order of problems listed above:  The patient is clinically stable with good exercise capacity on a combination of the low doses of carvedilol and Entresto.  She is also treated with [place].  She is not able to take her medicine on the morning of exercise because of lightheadedness.  I do not think she will tolerate further titration of her medications.  No changes are made today.  She reports home blood pressure readings in the 115-120 range over 70s.  Suspect a component of whitecoat hypertension today.  She also did not take her morning medicines. I cannot appreciate a murmur of mitral regurgitation on exam.  Her recent cardiac MRI is reviewed and shows mild to moderate mitral regurgitation.  Continue observation and clinical follow-up. Mild aortic dilatation has been noted on echo assessment.  Continue to treat blood pressure and no changes in medications made. Her lipids were elevated on last year's labs.  These are reviewed today and show a cholesterol of 227 with an LDL of 148.  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  We discussed treatment options today.  She is reluctant to take any lipid-lowering medications.  She states that she has been eating a lot of sweets and thinks she has room for improvement in her diet.  I will repeat labs in 4 to 6 months to assess her response to lifestyle modification.           Medication Adjustments/Labs and Tests Ordered: Current medicines are reviewed at length with the patient today.  Concerns regarding medicines are outlined above.  Orders Placed This Encounter  Procedures   Lipid panel   Comprehensive metabolic panel   EKG 81-WEXH   No orders of the defined types were placed in this encounter.   Patient Instructions  Medication Instructions:  Your physician  recommends that you continue on your current medications as directed. Please refer to the Current Medication list given to you today.  *If you need a refill on your cardiac medications before your next appointment, please call  your pharmacy*   Lab Work: CMET, Lipids in 62mIf you have labs (blood work) drawn today and your tests are completely normal, you will receive your results only by: [place](if you have MyChart) OR A paper copy in the mail If you have any lab test that is abnormal or we need to change your treatment, we will call you to review the results.   Testing/Procedures: NONE   Follow-Up: [facility] you and your health needs are our priority.  As part of our continuing mission to provide you with exceptional heart care, we have created designated Provider Care Teams.  These Care Teams include your primary Cardiologist (physician) and Advanced Practice Providers (APPs -  Physician Assistants and Nurse Practitioners) who all work together to provide you with the care you need, when you need it.  Your next appointment:   6 month(s)  The format for your next appointment:   In Person  Provider:   TNicholes Rough PA-C, EAmbrose Pancoast NP, MChristen Bame NP, or SRichardson Dopp PA-C     Then, MSherren Mocha MD will plan to see you again in 1 year(s).      Important Information About Sugar         Signed, MSherren Mocha MD  06/16/2022 1:13 PM    [place]

## 2022-08-28 ENCOUNTER — Ambulatory Visit (INDEPENDENT_AMBULATORY_CARE_PROVIDER_SITE_OTHER): Payer: 59 | Admitting: Family

## 2022-08-28 ENCOUNTER — Encounter: Payer: Self-pay | Admitting: Family

## 2022-08-28 VITALS — BP 138/78 | HR 48 | Temp 97.5°F | Resp 16 | Ht 64.0 in | Wt 137.0 lb

## 2022-08-28 DIAGNOSIS — G4730 Sleep apnea, unspecified: Secondary | ICD-10-CM

## 2022-08-28 DIAGNOSIS — Z0000 Encounter for general adult medical examination without abnormal findings: Secondary | ICD-10-CM

## 2022-08-28 DIAGNOSIS — I5020 Unspecified systolic (congestive) heart failure: Secondary | ICD-10-CM | POA: Diagnosis not present

## 2022-08-28 DIAGNOSIS — E785 Hyperlipidemia, unspecified: Secondary | ICD-10-CM

## 2022-08-28 DIAGNOSIS — I10 Essential (primary) hypertension: Secondary | ICD-10-CM | POA: Diagnosis not present

## 2022-08-28 LAB — CBC WITH DIFFERENTIAL/PLATELET
Basophils Absolute: 0.1 10*3/uL (ref 0.0–0.1)
Basophils Relative: 1.7 % (ref 0.0–3.0)
Eosinophils Absolute: 0.1 10*3/uL (ref 0.0–0.7)
Eosinophils Relative: 2.3 % (ref 0.0–5.0)
HCT: 41.7 % (ref 36.0–46.0)
Hemoglobin: 13.6 g/dL (ref 12.0–15.0)
Lymphocytes Relative: 36.5 % (ref 12.0–46.0)
Lymphs Abs: 1.6 10*3/uL (ref 0.7–4.0)
MCHC: 32.6 g/dL (ref 30.0–36.0)
MCV: 80.4 fl (ref 78.0–100.0)
Monocytes Absolute: 0.5 10*3/uL (ref 0.1–1.0)
Monocytes Relative: 10.6 % (ref 3.0–12.0)
Neutro Abs: 2.2 10*3/uL (ref 1.4–7.7)
Neutrophils Relative %: 48.9 % (ref 43.0–77.0)
Platelets: 322 10*3/uL (ref 150.0–400.0)
RBC: 5.19 Mil/uL — ABNORMAL HIGH (ref 3.87–5.11)
RDW: 15.1 % (ref 11.5–15.5)
WBC: 4.5 10*3/uL (ref 4.0–10.5)

## 2022-08-28 LAB — LIPID PANEL
Cholesterol: 236 mg/dL — ABNORMAL HIGH (ref 0–200)
HDL: 70.6 mg/dL (ref >39.00)
LDL Cholesterol: 152 mg/dL — ABNORMAL HIGH (ref 0–99)
NonHDL: 165.72
Total CHOL/HDL Ratio: 3
Triglycerides: 69 mg/dL (ref 0.0–149.0)
VLDL: 13.8 mg/dL (ref 0.0–40.0)

## 2022-08-28 LAB — COMPREHENSIVE METABOLIC PANEL
ALT: 19 U/L (ref 0–35)
AST: 22 U/L (ref 0–37)
Albumin: 4.5 g/dL (ref 3.5–5.2)
Alkaline Phosphatase: 80 U/L (ref 39–117)
BUN: 10 mg/dL (ref 6–23)
CO2: 29 mEq/L (ref 19–32)
Calcium: 9.2 mg/dL (ref 8.4–10.5)
Chloride: 103 mEq/L (ref 96–112)
Creatinine, Ser: 0.88 mg/dL (ref 0.40–1.20)
GFR: 74.73 mL/min (ref >60.00)
Glucose, Bld: 83 mg/dL (ref 70–99)
Potassium: 4 mEq/L (ref 3.5–5.1)
Sodium: 141 mEq/L (ref 135–145)
Total Bilirubin: 0.8 mg/dL (ref 0.2–1.2)
Total Protein: 7.3 g/dL (ref 6.0–8.3)

## 2022-08-28 LAB — TSH: TSH: 0.56 u[IU]/mL (ref 0.35–5.50)

## 2022-08-28 NOTE — Assessment & Plan Note (Signed)
Continue healthy diet and regular exercise. Weight is great- bmi is 23.  Colo, mammo and pap up to date.  Immunizations reviewed and up to date.

## 2022-08-28 NOTE — Assessment & Plan Note (Signed)
Normal home sleep study- 5/23.

## 2022-08-28 NOTE — Assessment & Plan Note (Signed)
Clinically stable. Following closely with cardiology.

## 2022-08-28 NOTE — Assessment & Plan Note (Signed)
Initial bp was elevated upon arrival but repeat bp reading is improved.   BP Readings from Last 3 Encounters:  08/28/22 138/78  06/16/22 132/88  12/05/21 116/76   No medication changes.

## 2022-08-28 NOTE — Progress Notes (Unsigned)
Subjective:     Patient ID: Jodi Hanson, female    DOB: 09/19/1966, 56 y.o.   MRN: [id number]  Chief Complaint  Patient presents with   Annual Exam         HPI  Patient presents today for complete physical.  Immunizations: 2020 tetanus, had second shingrix at the pharmacy, flu shot this year and also had covid booster in October.  Diet: healthy Exercise: regular- enjoys running, elliptical Colonoscopy: 2021 Pap Smear: 2022 Mammogram: 8/23 Vision:  up to date Dental:  up to date   Health Maintenance Due  Topic Date Due   Hepatitis C Screening  Never done   COVID-19 Vaccine (5 - 2023-24 season) 05/29/2022    Past Medical History:  Diagnosis Date   History of exercise stress test    Exercise tolerance test 11/2021: No ST deviation, excellent exercise capacity (Ex 12', METs 13.4)   Hypertension    Mitral regurgitation    Echocardiogram 2/22: EF 40-45, freq PVCs, mod conc LVH, mod to severe MR (tethering of post MV leaflet w resultant eccentric laterally directed MR), Gr 2 DD, normal RVSF, mod BAE, trivial pericardial effusion, trivial AI, mod dilation of ascending aorta (43 mm)   PVC's (premature ventricular contractions)    ZIO monitor 3/22: Sinus rhythm, PVC burden 9%, one 5 beat run NSVT    Past Surgical History:  Procedure Laterality Date   CESAREAN SECTION     HERNIA REPAIR  2005   WISDOM TOOTH EXTRACTION      Family History  Problem Relation Age of Onset   Diabetes Mother    Hyperlipidemia Mother    Hypertension Mother    Stroke Mother    Pancreatic cancer Mother        hospice are   Heart disease Father    Hypertension Brother    Hypertension Brother    HIV Brother    Heart failure Paternal Grandmother        died at 67   Lung cancer Paternal Grandfather    Hypertension Other    Colon cancer Neg Hx    Colon polyps Neg Hx    Esophageal cancer Neg Hx    Rectal cancer Neg Hx    Stomach cancer Neg Hx     Social History   Socioeconomic  History   Marital status: Divorced    Spouse name: Not on file   Number of children: Not on file   Years of education: Not on file   Highest education level: Not on file  Occupational History   Not on file  Tobacco Use   Smoking status: Never   Smokeless tobacco: Never  Substance and Sexual Activity   Alcohol use: Yes    Alcohol/week: 3.0 standard drinks of alcohol    Types: 3 Glasses of wine per week   Drug use: Never   Sexual activity: Not on file  Other Topics Concern   Not on file  Social History Narrative   Works as an Lobbyist-  on IT projects   One son born 1990- lives [place]   2 grandchildren   Enjoys friends, food, running, walking her dog      Social Determinants of Radio broadcast assistant Strain: Not on file  Food Insecurity: Not on file  Transportation Needs: Not on file  Physical Activity: Not on file  Stress: Not on file  Social Connections: Not on file  Intimate Partner Violence: Not on file    Outpatient  Medications Prior to Visit  Medication Sig Dispense Refill   Ascorbic Acid (VITAMIN C WITH ROSE HIPS) 500 MG tablet Take 500 mg by mouth daily.     Calcium Carbonate-Vit D-Min (CALCIUM 1200 PO) Take 1 tablet by mouth daily.     carvedilol (COREG) 3.125 MG tablet Take 1 tablet (3.125 mg total) by mouth 2 (two) times daily. 180 tablet 3   dapagliflozin propanediol (FARXIGA) 10 MG TABS tablet Take 1 tablet (10 mg total) by mouth daily before breakfast. 90 tablet 3   Multiple Vitamins-Minerals (MULTIVITAMIN WITH MINERALS) tablet Take 1 tablet by mouth daily.     sacubitril-valsartan (ENTRESTO) 24-26 MG Take 1 tablet by mouth 2 (two) times daily. 180 tablet 3   vitamin E 45 MG (100 UNITS) capsule Take 100 Units by mouth daily.     BLACK CURRANT SEED OIL PO Take by mouth daily. (Patient not taking: Reported on 9/83/3825)     GARLIC PO Take 1 tablet by mouth daily. (Patient not taking: Reported on 06/16/2022)     Melatonin 5 MG CAPS Take 5 mg by mouth at  bedtime. (Patient not taking: Reported on 06/16/2022)     Potassium 99 MG TABS Take 1 tablet by mouth daily. (Patient not taking: Reported on 06/16/2022)     vitamin B-12 (CYANOCOBALAMIN) 1000 MCG tablet Take 1,000 mcg by mouth daily. (Patient not taking: Reported on 06/16/2022)     No facility-administered medications prior to visit.    No Known Allergies  Review of Systems  Constitutional:  Negative for weight loss.  HENT:  Negative for congestion and hearing loss.   Eyes:  Negative for blurred vision.  Respiratory:  Negative for cough and shortness of breath.   Cardiovascular:  Negative for chest pain.  Gastrointestinal:  Negative for constipation and diarrhea.  Genitourinary:  Negative for dysuria and frequency.  Musculoskeletal:  Negative for joint pain and myalgias.  Skin:  Negative for rash.  Neurological:  Negative for headaches.  Psychiatric/Behavioral:         Denies depression/anxiety       Objective:    Physical Exam  BP 138/78   Pulse (!) 48   Temp (!) 97.5 F (36.4 C) (Oral)   Resp 16   Ht _0  (1.626 m)   Wt 137 lb (62.1 kg)   SpO2 100%   BMI 23.52 kg/m  Wt Readings from Last 3 Encounters:  08/28/22 137 lb (62.1 kg)  06/16/22 140 lb 9.6 oz (63.8 kg)  12/05/21 135 lb 6.4 oz (61.4 kg)  Physical Exam  Constitutional: She is oriented to person, place, and time. She appears well-developed and well-nourished. No distress.  HENT:  Head: Normocephalic and atraumatic.  Right Ear: Tympanic membrane and ear canal normal.  Left Ear: Tympanic membrane and ear canal normal.  Mouth/Throat: Not examined- pt wearing mask Eyes: Pupils are equal, round, and reactive to light. No scleral icterus.  Neck: Normal range of motion. No thyromegaly present.  Cardiovascular: Normal rate and regular rhythm.   Grade 1 systolic murmur heard. Pulmonary/Chest: Effort normal and breath sounds normal. No respiratory distress. He has no wheezes. She has no rales. She exhibits no  tenderness.  Abdominal: Soft. Bowel sounds are normal. She exhibits no distension and no mass. There is no tenderness. There is no rebound and no guarding.  Musculoskeletal: She exhibits no edema.  Lymphadenopathy:    She has no cervical adenopathy.  Neurological: She is alert and oriented to person, place, and time. She  has normal patellar reflexes. She exhibits normal muscle tone. Coordination normal.  Skin: Skin is warm and dry.  Psychiatric: She has a normal mood and affect. Her behavior is normal. Judgment and thought content normal.  Breast/pelvic: deferred          Assessment & Plan:        Assessment & Plan:   Problem List Items Addressed This Visit       Unprioritized   RESOLVED: Sleep-disordered breathing    Normal home sleep study- 5/23.       Preventative health care - Primary    Continue healthy diet and regular exercise. Weight is great- bmi is 23.  Colo, mammo and pap up to date.  Immunizations reviewed and up to date.       Relevant Orders   TSH (Completed)   CBC with Differential/Platelet (Completed)   HFmrEF (heart failure with mildly reduced ejection fraction) (HCC)    Clinically stable. Following closely with cardiology.       Essential hypertension    Initial bp was elevated upon arrival but repeat bp reading is improved.   BP Readings from Last 3 Encounters:  08/28/22 138/78  06/16/22 132/88  12/05/21 116/76  No medication changes.       Other Visit Diagnoses     Hyperlipidemia, unspecified hyperlipidemia type       Relevant Orders   Lipid panel (Completed)   Comp Met (CMET) (Completed)       I have discontinued Richardo Hanks. [place]'s Melatonin, GARLIC PO, BLACK CURRANT SEED OIL PO, Potassium, and cyanocobalamin. I am also having her maintain her multivitamin with minerals, Calcium Carbonate-Vit D-Min (CALCIUM 1200 PO), vitamin C with rose hips, vitamin E, carvedilol, dapagliflozin propanediol, and Entresto.  No orders of the defined  types were placed in this encounter.

## 2022-08-29 ENCOUNTER — Telehealth: Payer: Self-pay | Admitting: Family

## 2022-08-29 NOTE — Telephone Encounter (Signed)
See mychart.  

## 2022-09-09 ENCOUNTER — Encounter: Payer: Self-pay | Admitting: Family

## 2022-12-15 ENCOUNTER — Other Ambulatory Visit: Payer: Self-pay | Admitting: Physician Assistant

## 2022-12-24 ENCOUNTER — Other Ambulatory Visit: Payer: Self-pay | Admitting: Physician Assistant

## 2022-12-24 ENCOUNTER — Other Ambulatory Visit: Payer: Self-pay

## 2022-12-24 MED ORDER — DAPAGLIFLOZIN PROPANEDIOL 10 MG PO TABS: 10.0000 mg | ORAL_TABLET | Freq: Every day | ORAL | 1 refills | Status: DC

## 2022-12-24 NOTE — Telephone Encounter (Signed)
Pt's medication was sent to pt's pharmacy as requested. Confirmation received.  °

## 2023-01-09 ENCOUNTER — Other Ambulatory Visit: Payer: Self-pay | Admitting: Physician Assistant

## 2023-01-11 ENCOUNTER — Encounter: Payer: Self-pay | Admitting: *Deleted

## 2023-01-12 NOTE — Telephone Encounter (Signed)
Pt pharmacy requesting refill on medication that is not listed. Pleas address. Thank you 

## 2023-01-12 NOTE — Telephone Encounter (Signed)
All encounters in chart still show patient is taking Entresto low dose without changes. No labs done either that show it was stopped. Will send refill in at this time.  Last OV w/[place] 06/16/22: The patient is clinically stable with good exercise capacity on a combination of the low doses of carvedilol and Entresto.  She is also treated with [place].  She is not able to take her medicine on the morning of exercise because of lightheadedness.  I do not think she will tolerate further titration of her medications.  No changes are made today.    Saw PCP Peggyann Juba on 08/28/22: I have discontinued Phill Myron. Detzel's Melatonin, GARLIC PO, BLACK CURRANT SEED OIL PO, Potassium, and cyanocobalamin. I am also having her maintain her multivitamin with minerals, Calcium Carbonate-Vit D-Min (CALCIUM 1200 PO), vitamin C with rose hips, vitamin E, carvedilol, dapagliflozin propanediol, and Entresto.   Per [facility] walk-in clinic note on 09/13/22: Medications Reconcile with Patient's ChartMedications Medication SIG (Take, Route, Frequency, Duration) Notes Start Date End Date Status  Farxiga 10 MG 1 tablet Orally Once a day       Active  Carvedilol 3.125 MG 1 tablet with food Orally Twice a day       Active  Entresto 24-26 MG

## 2023-01-28 ENCOUNTER — Encounter: Payer: Self-pay | Admitting: Cardiovascular Disease

## 2023-01-29 MED ORDER — DIAZEPAM 5 MG PO TABS: ORAL_TABLET | ORAL | 0 refills | Status: DC

## 2023-01-29 NOTE — Telephone Encounter (Signed)
Ok to give valium 5 mg take one hour prior and may take an addition 5 mg 30 min prior if needed.

## 2023-01-29 NOTE — Telephone Encounter (Signed)
Diazepam 5mg  #2 called into [facility] pharmacy on file. Pt made aware via MyChart.

## 2023-02-09 ENCOUNTER — Telehealth (HOSPITAL_COMMUNITY): Payer: Self-pay | Admitting: *Deleted

## 2023-02-09 NOTE — Telephone Encounter (Signed)
Attempted to call patient regarding upcoming cardiac MRI appointment. Left message on voicemail with name and callback number  Roger Fasnacht RN Navigator Cardiac Imaging [facility] [phone] Office [phone] Cell  

## 2023-02-10 ENCOUNTER — Ambulatory Visit (HOSPITAL_COMMUNITY)
Admission: RE | Admit: 2023-02-10 | Discharge: 2023-02-10 | Disposition: A | Discharge status: Home / Self Care | Payer: 59 | Source: Ambulatory Visit | Attending: Physician Assistant | Admitting: Physician Assistant

## 2023-02-10 ENCOUNTER — Other Ambulatory Visit: Payer: Self-pay | Admitting: Physician Assistant

## 2023-02-10 DIAGNOSIS — I340 Nonrheumatic mitral (valve) insufficiency: Secondary | ICD-10-CM

## 2023-02-10 DIAGNOSIS — I429 Cardiomyopathy, unspecified: Secondary | ICD-10-CM | POA: Diagnosis not present

## 2023-02-10 DIAGNOSIS — I77810 Thoracic aortic ectasia: Secondary | ICD-10-CM | POA: Insufficient documentation

## 2023-02-10 DIAGNOSIS — I77819 Aortic ectasia, unspecified site: Secondary | ICD-10-CM

## 2023-02-10 MED ORDER — GADOBUTROL 1 MMOL/ML IV SOLN
6.0000 mL | Freq: Once | INTRAVENOUS | Status: AC | PRN
  Administered 2023-02-10: 6 mL via INTRAVENOUS

## 2023-02-11 ENCOUNTER — Telehealth: Payer: Self-pay | Admitting: *Deleted

## 2023-02-11 DIAGNOSIS — I77819 Aortic ectasia, unspecified site: Secondary | ICD-10-CM

## 2023-02-11 DIAGNOSIS — I5020 Unspecified systolic (congestive) heart failure: Secondary | ICD-10-CM

## 2023-02-11 DIAGNOSIS — I340 Nonrheumatic mitral (valve) insufficiency: Secondary | ICD-10-CM

## 2023-02-11 DIAGNOSIS — I429 Cardiomyopathy, unspecified: Secondary | ICD-10-CM

## 2023-02-11 DIAGNOSIS — I77810 Thoracic aortic ectasia: Secondary | ICD-10-CM

## 2023-02-11 NOTE — Telephone Encounter (Signed)
-----   Message from Beatrice Lecher, [place] sent at 02/11/2023  8:24 AM EDT ----- Results sent to Rodena Piety via MyChart. I will send a copy to PCP as FYI See MyChart comments below: -Repeat cardiac MRI, chest MRA in 1 year.  Jodi Hanson  Your MRI demonstrates an enlarged but stable size of your ascending thoracic aorta at 4.4 cm.  Your ejection fraction (heart function) is stable without significant change since the last study (44%).  Your mitral regurgitation is mild on this study.  There is evidence of a small collection of fluid around your heart called a pericardial effusion.  This has been present on studies since 2022.  Overall, your MRI findings are stable.  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 Continue current medications/treatment plan and follow up as scheduled.  Tereso Newcomer, PA-C    02/11/2023 8:22 AM

## 2023-03-14 ENCOUNTER — Other Ambulatory Visit: Payer: Self-pay | Admitting: Cardiovascular Disease

## 2023-06-10 ENCOUNTER — Other Ambulatory Visit: Payer: Self-pay | Admitting: Cardiovascular Disease

## 2023-06-10 MED ORDER — DAPAGLIFLOZIN PROPANEDIOL 10 MG PO TABS: 10.0000 mg | ORAL_TABLET | Freq: Every day | ORAL | 0 refills | Status: DC

## 2023-07-08 ENCOUNTER — Other Ambulatory Visit: Payer: Self-pay | Admitting: Cardiovascular Disease

## 2023-07-16 ENCOUNTER — Other Ambulatory Visit: Payer: Self-pay | Admitting: Physician Assistant

## 2023-07-21 ENCOUNTER — Ambulatory Visit: Payer: 59 | Admitting: Cardiovascular Disease

## 2023-07-26 ENCOUNTER — Ambulatory Visit: Payer: 59 | Admitting: Family

## 2023-07-26 DIAGNOSIS — Z23 Encounter for immunization: Secondary | ICD-10-CM

## 2023-07-26 DIAGNOSIS — Z1231 Encounter for screening mammogram for malignant neoplasm of breast: Secondary | ICD-10-CM

## 2023-07-26 DIAGNOSIS — L309 Dermatitis, unspecified: Secondary | ICD-10-CM | POA: Insufficient documentation

## 2023-07-26 MED ORDER — BETAMETHASONE DIPROPIONATE 0.05 % EX CREA: TOPICAL_CREAM | Freq: Two times a day (BID) | CUTANEOUS | 0 refills | Status: DC

## 2023-07-26 NOTE — Patient Instructions (Signed)
VISIT SUMMARY:  During today's visit, we addressed your recurrent rash, discussed your upcoming mammogram, administered the first dose of the Hepatitis A vaccine, and planned for a Hepatitis C screening.  YOUR PLAN:  -RECURRENT RASH: You have a recurrent, itchy rash on your abdomen, likely due to eczema. Eczema is a condition that makes your skin red, inflamed, and itchy. We will prescribe a stronger topical steroid cream for flare-ups and recommend using unscented moisturizers after showering to help manage the condition.  -MAMMOGRAM: You are due for your annual mammogram, which is an important screening tool for breast cancer. We have ordered a mammogram to be scheduled at the imaging department in the same building.  -HEPATITIS A VACCINATION: Given your upcoming travel to the [place], we discussed and administered the first dose of the Hepatitis A vaccine today. Hepatitis A is a liver infection caused by the Hepatitis A virus, and vaccination can help prevent it. The second dose should be scheduled in 6-12 months.  -HEPATITIS C SCREENING: Hepatitis C is a liver infection caused by the Hepatitis C virus. Screening is important for early detection and treatment. We plan to perform your Hepatitis C screening at your next physical in December.  INSTRUCTIONS:  Please schedule your mammogram at the imaging department in the same building. Also, remember to schedule the second dose of the Hepatitis A vaccine in 6-12 months. We will perform your Hepatitis C screening at your next physical in December.

## 2023-07-26 NOTE — Addendum Note (Signed)
Addended by: Wilford Corner on: 07/26/2023 08:14 AM   Modules accepted: Orders

## 2023-07-26 NOTE — Progress Notes (Signed)
Subjective:     Patient ID: Jodi Hanson, female    DOB: Oct 04, 1965, 57 y.o.   MRN: [id number]  Chief Complaint  Patient presents with   Rash    Patient reports having a recurrent rash like spot on abdomen    Rash    Discussed the use of AI scribe software for clinical note transcription with the patient, who gave verbal consent to proceed.  History of Present Illness   The patient presents with a recurrent rash on her abdomen beneath her anterior bra line on the right, first noticed months ago. Initially thought to be a mosquito bite, the rash has since reappeared at least twice in the same location. Each occurrence presents as a patch of three to four raised, itchy bumps. The patient has been self-treating with cortisone, which provides some relief. The rash is currently almost healed. The patient has been vaccinated for shingles and does not believe this to be the cause. She also reports a history of eczema.  In addition to the rash, the patient is due for a mammogram and has received her flu shot and most recent COVID-19 vaccine. She is also considering getting the hepatitis A vaccine due to upcoming travel plans.          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), Gr 2 DD, normal RVSF, mod BAE, trivial pericardial effusion, trivial AI, mod dilation of ascending aorta (43 mm)   PVC's (premature ventricular contractions)    ZIO monitor 3/22: Sinus rhythm, PVC burden 9%, one 5 beat run NSVT    Past Surgical History:  Procedure Laterality Date    CESAREAN SECTION     HERNIA REPAIR  2005   WISDOM TOOTH EXTRACTION      Family History  Problem Relation Age of Onset   Diabetes Mother    Hyperlipidemia Mother    Hypertension Mother    Stroke Mother    Pancreatic cancer Mother        hospice are   Heart disease Father    Hypertension Brother    Hypertension Brother    HIV Brother    Heart failure Paternal Grandmother        died at 75   Lung cancer Paternal Grandfather    Hypertension Other    Colon cancer Neg Hx    Colon polyps Neg Hx    Esophageal cancer Neg Hx    Rectal cancer Neg Hx    Stomach cancer Neg Hx     Social History   Socioeconomic History   Marital status: Divorced    Spouse name: Not on file   Number of children: Not on file   Years of education: Not on file   Highest education level: Master's degree (e.g., MA, MS, MEng, MEd, MSW, MBA)  Occupational History   Not on file  Tobacco Use   Smoking status: Never   Smokeless tobacco: Never  Substance and Sexual Activity   Alcohol use: Yes    Alcohol/week: 3.0 standard drinks of  alcohol    Types: 3 Glasses of wine per week   Drug use: Never   Sexual activity: Not on file  Other Topics Concern   Not on file  Social History Narrative   Works as an Psychologist, educational-  on IT projects   One son born 1990- lives [place]   2 grandchildren   Enjoys friends, food, running, walking her dog      Social Determinants of Health   Financial Resource Strain: Low Risk  (07/19/2023)   Overall Financial Resource Strain (CARDIA)    Difficulty of Paying Living Expenses: Not hard at all  Food Insecurity: No Food Insecurity (07/19/2023)   Hunger Vital Sign    Worried About Running Out of Food in the Last Year: Never true    Ran Out of Food in the Last Year: Never true  Transportation Needs: No Transportation Needs (07/19/2023)   PRAPARE - Administrator, Civil Service (Medical): No    Lack of Transportation (Non-Medical): No  Physical Activity: Sufficiently  Active (07/19/2023)   Exercise Vital Sign    Days of Exercise per Week: 4 days    Minutes of Exercise per Session: 60 min  Stress: No Stress Concern Present (07/19/2023)   [employer] of Occupational Health - Occupational Stress Questionnaire    Feeling of Stress : Only a little  Social Connections: Moderately Integrated (07/19/2023)   Social Connection and Isolation Panel [NHANES]    Frequency of Communication with Friends and Family: More than three times a week    Frequency of Social Gatherings with Friends and Family: More than three times a week    Attends Religious Services: More than 4 times per year    Active Member of [employer] or Organizations: Yes    Attends Engineer, structural: More than 4 times per year    Marital Status: Divorced  Catering manager Violence: Not on file    Outpatient Medications Prior to Visit  Medication Sig Dispense Refill   Ascorbic Acid (VITAMIN C WITH ROSE HIPS) 500 MG tablet Take 500 mg by mouth daily.     Calcium Carbonate-Vit D-Min (CALCIUM 1200 PO) Take 1 tablet by mouth daily.     carvedilol (COREG) 3.125 MG tablet TAKE 1 TABLET BY MOUTH TWICE A DAY 180 tablet 0   dapagliflozin propanediol (FARXIGA) 10 MG TABS tablet TAKE 1 TABLET BY MOUTH DAILY BEFORE BREAKFAST. 15 tablet 0   Multiple Vitamins-Minerals (MULTIVITAMIN WITH MINERALS) tablet Take 1 tablet by mouth daily.     sacubitril-valsartan (ENTRESTO) 24-26 MG TAKE 1 TABLET BY MOUTH TWICE A DAY 180 tablet 3   vitamin E 45 MG (100 UNITS) capsule Take 100 Units by mouth daily.     diazepam (VALIUM) 5 MG tablet Take 1 tablet by mouth one hour prior to MRI. May repeat second dose 30 minutes prior if needed. 2 tablet 0   No facility-administered medications prior to visit.    No Known Allergies  Review of Systems  Skin:  Positive for rash.      See HPI Objective:    Physical Exam Constitutional:      General: She is not in acute distress.    Appearance: Normal appearance.  She is well-developed.  HENT:     Head: Normocephalic and atraumatic.     Right Ear: External ear normal.     Left Ear: External ear normal.  Eyes:     General: No scleral icterus. Neck:     Thyroid: No  thyromegaly.  Cardiovascular:     Rate and Rhythm: Normal rate and regular rhythm.     Heart sounds: Normal heart sounds. No murmur heard. Pulmonary:     Effort: Pulmonary effort is normal. No respiratory distress.     Breath sounds: Normal breath sounds. No wheezing.  Musculoskeletal:     Cervical back: Neck supple.  Skin:    General: Skin is warm and dry.     Comments: No rash today noted  Neurological:     Mental Status: She is alert and oriented to person, place, and time.  Psychiatric:        Mood and Affect: Mood normal.        Behavior: Behavior normal.        Thought Content: Thought content normal.        Judgment: Judgment normal.      There were no vitals taken for this visit. Wt Readings from Last 3 Encounters:  08/28/22 137 lb (62.1 kg)  06/16/22 140 lb 9.6 oz (63.8 kg)  12/05/21 135 lb 6.4 oz (61.4 kg)       Assessment & Plan:   Problem List Items Addressed This Visit       Unprioritized   Preventative health care - Primary    Will order mammogram. Formal cpx scheduled for December.      Eczema    Intermittent itching rash. Rx provided for betamethasone cream. Recommended good moisturizer daily such as cetaphil or eucerin.      She would like to receive Hep A vaccine today.   I have discontinued Phill Myron. Amadon's diazepam. I am also having her start on betamethasone dipropionate. Additionally, I am having her maintain her multivitamin with minerals, Calcium Carbonate-Vit D-Min (CALCIUM 1200 PO), vitamin C with rose hips, vitamin E, Entresto, carvedilol, and [place].  Meds ordered this encounter  Medications   betamethasone dipropionate 0.05 % cream    Sig: Apply topically 2 (two) times daily.    Dispense:  30 g    Refill:  0    Order  Specific Question:   Supervising Provider    Answer:   Danise Edge A [[id number]]

## 2023-07-26 NOTE — Assessment & Plan Note (Signed)
Will order mammogram. Formal cpx scheduled for December.

## 2023-07-26 NOTE — Assessment & Plan Note (Signed)
Intermittent itching rash. Rx provided for betamethasone cream. Recommended good moisturizer daily such as cetaphil or eucerin.

## 2023-07-27 ENCOUNTER — Ambulatory Visit: Payer: 59 | Attending: Cardiovascular Disease | Admitting: Nurse Practitioner

## 2023-07-27 ENCOUNTER — Encounter: Payer: Self-pay | Admitting: Nurse Practitioner

## 2023-07-27 VITALS — BP 112/80 | HR 55 | Ht 64.0 in | Wt 145.0 lb

## 2023-07-27 DIAGNOSIS — I77819 Aortic ectasia, unspecified site: Secondary | ICD-10-CM

## 2023-07-27 DIAGNOSIS — I5020 Unspecified systolic (congestive) heart failure: Secondary | ICD-10-CM

## 2023-07-27 DIAGNOSIS — I493 Ventricular premature depolarization: Secondary | ICD-10-CM | POA: Diagnosis not present

## 2023-07-27 DIAGNOSIS — I10 Essential (primary) hypertension: Secondary | ICD-10-CM

## 2023-07-27 DIAGNOSIS — E782 Mixed hyperlipidemia: Secondary | ICD-10-CM

## 2023-07-27 MED ORDER — DAPAGLIFLOZIN PROPANEDIOL 10 MG PO TABS: 10.0000 mg | ORAL_TABLET | Freq: Every day | ORAL | 3 refills | Status: DC

## 2023-07-27 NOTE — Patient Instructions (Addendum)
Medication Instructions:  Your physician recommends that you continue on your current medications as directed. Please refer to the Current Medication list given to you today.  *If you need a refill on your cardiac medications before your next appointment, please call your pharmacy*   Lab Work: NONE ordered at this time of appointment     Testing/Procedures: NONE ordered at this time of appointment     Follow-Up: At [facility], you and your health needs are our priority.  As part of our continuing mission to provide you with exceptional heart care, we have created designated Provider Care Teams.  These Care Teams include your primary Cardiologist (physician) and Advanced Practice Providers (APPs -  Physician Assistants and Nurse Practitioners) who all work together to provide you with the care you need, when you need it.  We recommend signing up for the patient portal called "MyChart".  Sign up information is provided on this After Visit Summary.  MyChart is used to connect with patients for Virtual Visits (Telemedicine).  Patients are able to view lab/test results, encounter notes, upcoming appointments, etc.  Non-urgent messages can be sent to your provider as well.   To learn more about what you can do with MyChart, go to [email or web address].    Your next appointment:   1 year(s)  Provider:   Tonny Bollman, MD

## 2023-07-27 NOTE — Progress Notes (Signed)
Office Visit    Patient Name: Jodi Hanson Date of Encounter: 07/27/2023  Primary Care Provider:  Sandford Craze, NP Primary Cardiologist:  Tonny Bollman, MD  Chief Complaint    57 year old female with a history of heart failure with mildly reduced ejection fraction, mitral valve regurgitation, PVCs, dilated ascending aorta, hypertension, and hyperlipidemia who presents for follow-up related to heart failure.  Past Medical History    Past Medical History:  Diagnosis Date   History of exercise stress test    Exercise tolerance test 11/2021: No ST deviation, excellent exercise capacity (Ex 12', METs 13.4)   Hypertension    Mitral regurgitation    Echocardiogram 2/22: EF 40-45, freq PVCs, mod conc LVH, mod to severe MR (tethering of post MV leaflet w resultant eccentric laterally directed MR), Gr 2 DD, normal RVSF, mod BAE, trivial pericardial effusion, trivial AI, mod dilation of ascending aorta (43 mm)   PVC's (premature ventricular contractions)    ZIO monitor 3/22: Sinus rhythm, PVC burden 9%, one 5 beat run NSVT   Past Surgical History:  Procedure Laterality Date   CESAREAN SECTION     HERNIA REPAIR  2005   WISDOM TOOTH EXTRACTION      Allergies  No Known Allergies   Labs/Other Studies Reviewed    The following studies were reviewed today:  Cardiac Studies & Procedures     STRESS TESTS  EXERCISE TOLERANCE TEST (ETT) 12/25/2021  Interpretation Summary   No ST deviation was noted.   Exercise capacity was excellent. Patient exercised for 12 min and 0 sec. Maximum HR of 153 bpm. MPHR 91.0 %. Peak METS 13.4 .   Hypertensive blood pressure and normal heart rate response noted during stress. Heart rate recovery was normal.   ECHOCARDIOGRAM  ECHOCARDIOGRAM COMPLETE 03/11/2022  Narrative ECHOCARDIOGRAM REPORT    Patient Name:   Jodi Hanson Date of Exam: 03/11/2022 Medical Rec #:  [id number]       Height:       64.0 in Accession #:    [id number]       Weight:       135.4 lb Date of Birth:  1966/02/11       BSA:          1.657 m Patient Age:    55 years        BP:           116/76 mmHg Patient Gender: F               HR:           48 bpm. Exam Location:  [address]  Procedure: 2D Echo, Cardiac Doppler and Color Doppler  Indications:     I50.21 CHF  History:         Patient has prior history of Echocardiogram examinations, most recent 11/20/2020. CHF, Dilated ascending aorta, Arrythmias:PVC; Risk Factors:Hypertension.  Sonographer:     Samule Ohm RDCS Referring Phys:  [id number] Bio WEAVER Diagnosing Phys: Zoila Shutter MD  IMPRESSIONS   1. Left ventricular ejection fraction, by estimation, is 40 to 45%. Left ventricular ejection fraction by PLAX is 43 %. The left ventricle has mildly decreased function. The left ventricle demonstrates regional wall motion abnormalities (see scoring diagram/findings for description). There is mild left ventricular hypertrophy. Left ventricular diastolic parameters were normal. There is severe akinesis of the left ventricular, basal-mid inferior wall, inferoseptal wall and inferolateral wall. 2. Right ventricular systolic function is normal. The right ventricular size is  normal. There is normal pulmonary artery systolic pressure. The estimated right ventricular systolic pressure is 23.2 mmHg. 3. Left atrial size was severely dilated. 4. Right atrial size was mildly dilated. 5. The pericardial effusion is circumferential. There is no evidence of cardiac tamponade. 6. The mitral valve is abnormal. Mild to moderate mitral valve regurgitation. 7. The aortic valve is tricuspid. Aortic valve regurgitation is not visualized. 8. Aortic dilatation noted. There is borderline dilatation of the aortic root, measuring 39 mm. There is mild dilatation of the ascending aorta, measuring 41 mm. 9. The inferior vena cava is normal in size with greater than 50% respiratory variability, suggesting right atrial pressure  of 3 mmHg.  Comparison(s): No significant change from prior study. 11/20/2020: LVEF 40-45% - global HK reported, but there is a clear inferior and inferoseptal WMA consistent with likely prior inferior infarct.  FINDINGS Left Ventricle: Left ventricular ejection fraction, by estimation, is 40 to 45%. Left ventricular ejection fraction by PLAX is 43 %. The left ventricle has mildly decreased function. The left ventricle demonstrates regional wall motion abnormalities. Severe akinesis of the left ventricular, basal-mid inferior wall, inferoseptal wall and inferolateral wall. The left ventricular internal cavity size was normal in size. There is mild left ventricular hypertrophy. Left ventricular diastolic parameters were normal.  Right Ventricle: The right ventricular size is normal. No increase in right ventricular wall thickness. Right ventricular systolic function is normal. There is normal pulmonary artery systolic pressure. The tricuspid regurgitant velocity is 2.25 m/s, and with an assumed right atrial pressure of 3 mmHg, the estimated right ventricular systolic pressure is 23.2 mmHg.  Left Atrium: Left atrial size was severely dilated.  Right Atrium: Right atrial size was mildly dilated.  Pericardium: Trivial pericardial effusion is present. The pericardial effusion is circumferential. There is no evidence of cardiac tamponade.  Mitral Valve: The mitral valve is abnormal. There is mild thickening of the anterior and posterior mitral valve leaflet(s). Mild to moderate mitral valve regurgitation, with posteriorly-directed jet.  Tricuspid Valve: The tricuspid valve is grossly normal. Tricuspid valve regurgitation is trivial.  Aortic Valve: The aortic valve is tricuspid. Aortic valve regurgitation is not visualized. Aortic regurgitation PHT measures 1054 msec.  Pulmonic Valve: The pulmonic valve was grossly normal. Pulmonic valve regurgitation is trivial.  Aorta: Aortic dilatation noted.  There is borderline dilatation of the aortic root, measuring 39 mm. There is mild dilatation of the ascending aorta, measuring 41 mm.  Venous: The inferior vena cava is normal in size with greater than 50% respiratory variability, suggesting right atrial pressure of 3 mmHg.  IAS/Shunts: No atrial level shunt detected by color flow Doppler.   LEFT VENTRICLE PLAX 2D LV EF:         Left            Diastology ventricular     LV e' medial:    7.94 cm/s ejection        LV E/e' medial:  10.4 fraction by     LV e' lateral:   11.50 cm/s PLAX is 43      LV E/e' lateral: 7.2 %. LVIDd:         5.60 cm LVIDs:         4.40 cm LV PW:         0.80 cm LV IVS:        1.10 cm   RIGHT VENTRICLE             IVC RV S prime:  10.60 cm/s  IVC diam: 1.10 cm TAPSE (M-mode): 2.1 cm RVSP:           23.2 mmHg  LEFT ATRIUM              Index        RIGHT ATRIUM           Index LA diam:        3.60 cm  2.17 cm/m   RA Pressure: 3.00 mmHg LA Vol (A2C):   67.8 ml  40.91 ml/m  RA Area:     21.40 cm LA Vol (A4C):   102.0 ml 61.54 ml/m  RA Volume:   56.80 ml  34.27 ml/m LA Biplane Vol: 85.9 ml  51.83 ml/m AORTIC VALVE LVOT Vmax:   96.60 cm/s LVOT Vmean:  68.200 cm/s LVOT VTI:    0.226 m AI PHT:      1054 msec  AORTA Ao Root diam: 3.90 cm Ao Asc diam:  4.10 cm  MITRAL VALVE               TRICUSPID VALVE MV Area (PHT): 2.93 cm    TR Peak grad:   20.2 mmHg MV Decel Time: 259 msec    TR Vmax:        225.00 cm/s MV E velocity: 82.30 cm/s  Estimated RAP:  3.00 mmHg MV A velocity: 56.10 cm/s  RVSP:           23.2 mmHg MV E/A ratio:  1.47 SHUNTS Systemic VTI: 0.23 m  Zoila Shutter MD Electronically signed by Zoila Shutter MD Signature Date/Time: 03/11/2022/11:29:49 AM    Final (Updated)    MONITORS  LONG TERM MONITOR (3-14 DAYS) 12/10/2020  Narrative Patch Wear Time:  3 days and 10 hours (2022-03-01T23:09:17-0500 to 2022-03-05T09:09:39-0500)  Patient had a min HR of 44 bpm, max HR of 141  bpm, and avg HR of 74 bpm. Predominant underlying rhythm was Sinus Rhythm. 1 run of Ventricular Tachycardia occurred lasting 5 beats with a max rate of 141 bpm (avg 134 bpm). Isolated SVEs were rare (<1.0%), SVE Couplets were rare (<1.0%), and no SVE Triplets were present. Isolated VEs were frequent (9.0%, 31950), VE Couplets were rare (<1.0%, 326), and VE Triplets were rare (<1.0%, 1). Ventricular Bigeminy and Trigeminy were present.  Summary: The basic rhythm is normal sinus with an average heart rate of 74 bpm There are frequent PVCs with an overall PVC burden of 9% There is one 5 beat ventricular run There is no atrial arrhythmia, high-grade AV block, or sustained ventricular arrhythmia.    CARDIAC MRI  MR CARDIAC MORPHOLOGY W WO CONTRAST 02/10/2023  Narrative CLINICAL DATA:  Cardiomyopathy, mitral regurgitation  EXAM: CARDIAC MRI  TECHNIQUE: The patient was scanned on a 1.5 Tesla GE magnet. A dedicated cardiac coil was used. Functional imaging was done using Fiesta sequences. 2,3, and 4 chamber views were done to assess for RWMA's. Modified Simpson's rule using a short axis stack was used to calculate an ejection fraction on a dedicated work [employer]. The patient received 10 cc of Gadavist. After 10 minutes inversion recovery sequences were used to assess for infiltration and scar tissue.  CONTRAST:  Gadavist 10 cc  FINDINGS: Limited images of the lung fields showed no gross abnormalities.  There is a small-moderate circumferential pericardial effusion without evidence for tamponade.  Mildly dilated left ventricle with normal wall thickness. Basal inferoseptal/inferior/inferolateral and mid inferior/inferolateral akinesis, LV EF 44%. Normal right ventricular size with mild systolic  dysfunction, EF 45%. Moderate biatrial enlargement. Trileaflet aortic valve, no significant stenosis or regurgitation. By regurgitant fraction of 8%, mitral regurgitation  appears to be mild.  On delayed enhancement imaging, there was >50% wall thickness subendocardial late gadolinium enhancement (LGE) in the basal inferoseptal, inferior and inferolateral walls and in the mid inferior and inferolateral walls.  MRA: Pulmonary veins drain normally to the left atrium. Normal arch vessel origins. Aortic root 31 mm, ascending aorta 43 mm, 29 mm aortic arch  MEASUREMENTS: MEASUREMENTS LVEDV 210 mL  LVEDVi 125 mL/m2  LVSV 92 mL  LVEF 44%  RVEDV 175 mL  RVEDVi 105 mL/m2  RVSV 78 mL  RVEF 45%  Aortic forward volume 85 mL  Aortic regurgitant fraction 0%  IMPRESSION: 1.  Small-moderate circumferential pericardial effusion.  2. Mildly dilated LV with wall motion abnormalities as noted above. LV EF 44%.  3.  Normal RV size with mildly decreased systolic function, EF 45%.  4. LGE pattern suggestive of prior myocardial infarction involving the basal to mid inferoseptal, inferior, and inferolateral wall segments. With > 50% wall thickness subendocardial LGE, these segments are unlikely to improve in function with revascularization.  5.  Mitral regurgitation appears mild by regurgitant fraction.  6. Dilated ascending aorta to 4.3 cm. Would repeat ascending aorta imaging in 1 year.  Dalton Mclean   Electronically Signed By: Marca Ancona M.D. On: 02/10/2023 17:27        Recent Labs: 08/28/2022: ALT 19; BUN 10; Creatinine, Ser 0.88; Hemoglobin 13.6; Platelets 322.0; Potassium 4.0; Sodium 141; TSH 0.56  Recent Lipid Panel    Component Value Date/Time   CHOL 236 (H) 08/28/2022 1059   TRIG 69.0 08/28/2022 1059   HDL 70.60 08/28/2022 1059   CHOLHDL 3 08/28/2022 1059   VLDL 13.8 08/28/2022 1059   LDLCALC 152 (H) 08/28/2022 1059    History of Present Illness    57 year old female with the above past medical history including heart failure with mildly reduced ejection fraction, mitral valve regurgitation, PVCs, dilated ascending aorta,  hypertension, and hyperlipidemia.  Echocardiogram in 10/2020 showed EF 40 to 45%, moderate to severe mitral valve regurgitation, frequent PVCs were noted.  Cardiac monitor in 11/2020 revealed 9% PVC burden.  Cardiac MRI in 01/2021 revealed mild LVH, EF 50%, normal RV, moderate mitral valve regurgitation, delayed enhancement pattern suggesting prior myocardial infarction in the basal to mid inferior and inferolateral walls.  ETT in 11/2021 was negative for ischemia.  She was last seen in the office on 06/16/2022 and was doing well from a cardiac standpoint.  She was exercising regularly.  Repeat cardiac MRI in 01/2023 revealed small to moderate circumferential pericardial effusion, mildly dilated LV, EF 44%, mildly decreased RV systolic function global EF 45%, LGE pattern suggestive prior bradycardia infarction, mild mitral valve regurgitation, dilation of ascending aorta measuring 4.3 cm. Repeat MRI was recommended in 1 year.  She presents today for follow-up.  Since her last visit he has been stable from a cardiac standpoint.  She denies any symptoms concerning for angina, denies palpitations, dizziness, dyspnea, edema, PND, orthopnea, weight gain.  He is active, exercises regularly.  Overall, she reports feeling well.  Home Medications    Current Outpatient Medications  Medication Sig Dispense Refill   Ascorbic Acid (VITAMIN C WITH ROSE HIPS) 500 MG tablet Take 500 mg by mouth daily.     betamethasone dipropionate 0.05 % cream Apply topically 2 (two) times daily. 30 g 0   Calcium Carbonate-Vit D-Min (CALCIUM 1200 PO) Take  1 tablet by mouth daily.     carvedilol (COREG) 3.125 MG tablet TAKE 1 TABLET BY MOUTH TWICE A DAY 180 tablet 0   Multiple Vitamins-Minerals (MULTIVITAMIN WITH MINERALS) tablet Take 1 tablet by mouth daily.     sacubitril-valsartan (ENTRESTO) 24-26 MG TAKE 1 TABLET BY MOUTH TWICE A DAY 180 tablet 3   vitamin E 45 MG (100 UNITS) capsule Take 100 Units by mouth daily.     dapagliflozin  propanediol (FARXIGA) 10 MG TABS tablet Take 1 tablet (10 mg total) by mouth daily before breakfast. 90 tablet 3   No current facility-administered medications for this visit.     Review of Systems    She denies chest pain, palpitations, dyspnea, pnd, orthopnea, n, v, dizziness, syncope, edema, weight gain, or early satiety. All other systems reviewed and are otherwise negative except as noted above.   Physical Exam    VS:  BP 112/80   Pulse (!) 55   Ht 5\' 4"  (1.626 m)   Wt 145 lb (65.8 kg)   SpO2 97%   BMI 24.89 kg/m . STOP-Bang Score:         GEN: Well nourished, well developed, in no acute distress. HEENT: normal. Neck: Supple, no JVD, carotid bruits, or masses. Cardiac: RRR, no murmurs, rubs, or gallops. No clubbing, cyanosis, edema.  Radials/DP/PT 2+ and equal bilaterally.  Respiratory:  Respirations regular and unlabored, clear to auscultation bilaterally. GI: Soft, nontender, nondistended, BS + x 4. MS: no deformity or atrophy. Skin: warm and dry, no rash. Neuro:  Strength and sensation are intact. Psych: Normal affect.  Accessory Clinical Findings    ECG personally reviewed by me today - EKG Interpretation Date/Time:  Tuesday July 27 2023 08:10:40 EDT Ventricular Rate:  55 PR Interval:  190 QRS Duration:  104 QT Interval:  492 QTC Calculation: 470 R Axis:   10  Text Interpretation: Sinus bradycardia Nonspecific T wave abnormality No previous ECGs available Confirmed by Bernadene Person ([id number]) on 07/27/2023 8:15:35 AM  - no acute changes.   Lab Results  Component Value Date   WBC 4.5 08/28/2022   HGB 13.6 08/28/2022   HCT 41.7 08/28/2022   MCV 80.4 08/28/2022   PLT 322.0 08/28/2022   Lab Results  Component Value Date   CREATININE 0.88 08/28/2022   BUN 10 08/28/2022   NA 141 08/28/2022   K 4.0 08/28/2022   CL 103 08/28/2022   CO2 29 08/28/2022   Lab Results  Component Value Date   ALT 19 08/28/2022   AST 22 08/28/2022   ALKPHOS 80 08/28/2022    BILITOT 0.8 08/28/2022   Lab Results  Component Value Date   CHOL 236 (H) 08/28/2022   HDL 70.60 08/28/2022   LDLCALC 152 (H) 08/28/2022   TRIG 69.0 08/28/2022   CHOLHDL 3 08/28/2022    No results found for: "HGBA1C"  Assessment & Plan    1. HFmrEF: Echocardiogram in 10/2020 showed EF 40 to 45%, moderate to severe mitral valve regurgitation, frequent PVCs were noted.  Most recent cardiac MRI in 01/2023 revealed small to moderate circumferential pericardial effusion, mildly dilated LV, EF 44%, mildly decreased RV systolic function global EF 45%, LGE pattern suggestive prior bradycardia infarction, mild mitral valve regurgitation, dilation of ascending aorta measuring 4.3 cm. Repeat MRI was recommended in 1 year (order has been placed). Euvolemic and well compensated on exam.  Continue carvedilol, Sherryll Burger, Farxiga.  2. Mitral valve regurgitation: Mild on most recent cMRI.  Will continue to monitor  with routine repeat cardiac MRI.  3. PVCs/bradycardia: She denies any palpitations, dizziness, presyncope or syncope.  Stable on carvedilol.  4. Dilation of ascending aorta: Stable on recent cMRI, 43 mm. Plan for repeat MRI in 01/2024 as above.   5. Hypertension: BP well controlled. Continue current antihypertensive regimen.   6. Hyperlipidemia: LDL was 152 in 08/2022.  She will have repeat labs with her PCP in December.  Encouraged ongoing lifestyle modifications with diet and exercise, may need to consider statin therapy at some point in the future if LDL remains significantly elevated.  7. Disposition: Follow-up in 1 year, sooner if needed.       Joylene Grapes, NP 07/27/2023, 8:38 AM

## 2023-07-29 ENCOUNTER — Ambulatory Visit (HOSPITAL_BASED_OUTPATIENT_CLINIC_OR_DEPARTMENT_OTHER)
Admission: RE | Admit: 2023-07-29 | Discharge: 2023-07-29 | Disposition: A | Discharge status: Home / Self Care | Payer: 59 | Source: Ambulatory Visit | Attending: Family | Admitting: Family

## 2023-07-29 ENCOUNTER — Encounter (HOSPITAL_BASED_OUTPATIENT_CLINIC_OR_DEPARTMENT_OTHER): Payer: Self-pay

## 2023-07-29 DIAGNOSIS — Z1231 Encounter for screening mammogram for malignant neoplasm of breast: Secondary | ICD-10-CM | POA: Diagnosis present

## 2023-09-01 ENCOUNTER — Encounter: Payer: 59 | Admitting: Family

## 2023-09-05 ENCOUNTER — Other Ambulatory Visit: Payer: Self-pay | Admitting: Cardiovascular Disease

## 2023-09-17 ENCOUNTER — Ambulatory Visit (INDEPENDENT_AMBULATORY_CARE_PROVIDER_SITE_OTHER): Payer: 59 | Admitting: Family

## 2023-09-17 ENCOUNTER — Encounter: Payer: Self-pay | Admitting: Family

## 2023-09-17 VITALS — BP 114/73 | HR 52 | Temp 97.5°F | Resp 16 | Ht 64.0 in | Wt 145.0 lb

## 2023-09-17 DIAGNOSIS — E785 Hyperlipidemia, unspecified: Secondary | ICD-10-CM

## 2023-09-17 DIAGNOSIS — Z0000 Encounter for general adult medical examination without abnormal findings: Secondary | ICD-10-CM | POA: Diagnosis not present

## 2023-09-17 DIAGNOSIS — Z1159 Encounter for screening for other viral diseases: Secondary | ICD-10-CM

## 2023-09-17 LAB — LIPID PANEL
Cholesterol: 245 mg/dL — ABNORMAL HIGH (ref 0–200)
HDL: 70.8 mg/dL (ref >39.00)
LDL Cholesterol: 159 mg/dL — ABNORMAL HIGH (ref 0–99)
NonHDL: 174.32
Total CHOL/HDL Ratio: 3
Triglycerides: 76 mg/dL (ref 0.0–149.0)
VLDL: 15.2 mg/dL (ref 0.0–40.0)

## 2023-09-17 LAB — COMPREHENSIVE METABOLIC PANEL
ALT: 20 U/L (ref 0–35)
AST: 23 U/L (ref 0–37)
Albumin: 4.4 g/dL (ref 3.5–5.2)
Alkaline Phosphatase: 77 U/L (ref 39–117)
BUN: 13 mg/dL (ref 6–23)
CO2: 29 meq/L (ref 19–32)
Calcium: 9.4 mg/dL (ref 8.4–10.5)
Chloride: 104 meq/L (ref 96–112)
Creatinine, Ser: 0.92 mg/dL (ref 0.40–1.20)
GFR: 70.33 mL/min (ref >60.00)
Glucose, Bld: 99 mg/dL (ref 70–99)
Potassium: 4.6 meq/L (ref 3.5–5.1)
Sodium: 140 meq/L (ref 135–145)
Total Bilirubin: 0.9 mg/dL (ref 0.2–1.2)
Total Protein: 7.1 g/dL (ref 6.0–8.3)

## 2023-09-17 NOTE — Patient Instructions (Signed)
VISIT SUMMARY:  Today, you came in for a routine physical and reported feeling well overall. You have been maintaining a healthy diet and regular exercise routine, and you are up to date on all your screenings and vaccinations. We discussed your cardiovascular health, Hepatitis C screening, and general health maintenance.  YOUR PLAN:  -CARDIOVASCULAR HEALTH: Your cardiovascular health is stable, and you had a recent follow-up with your cardiologist in October. We will repeat your cholesterol panel today as recommended by your cardiologist to monitor your heart health.  -HEPATITIS C SCREENING: Hepatitis C is a liver infection caused by the Hepatitis C virus. We discussed the importance of a one-time screening for all adults, and you agreed to have this screening done today.  -GENERAL HEALTH MAINTENANCE: You are up to date on your colonoscopy, Pap smear, mammogram, and COVID-19 booster. You also have regular dental and vision check-ups. Continue with your current health maintenance routine.  -DIET AND EXERCISE: You are following a healthy diet and engaging in regular strength training and cardio exercise 4-5 days a week. Keep up the good work with your diet and exercise regimen.  INSTRUCTIONS:  We will repeat your cholesterol panel today and order a Hepatitis C screening. Please continue your regular follow-up with cardiology and check your lab results. Follow up as needed based on the results.

## 2023-09-17 NOTE — Assessment & Plan Note (Signed)
  Up-to-date on colonoscopy, Pap smear, mammogram, and flu shot and COVID-19 booster. Regular dental and vision check-ups up to date. -Continue current health maintenance regimen.  Diet and Exercise Patient reports a 70% healthy diet and regular strength training and cardio exercise 4-5 days a week. -Encourage continuation of healthy diet and regular exercise.  Follow-up No new symptoms or concerns reported. Physical exam unremarkable. -Continue regular follow-up with cardiology. -Check lab results and follow-up as needed.

## 2023-09-17 NOTE — Progress Notes (Signed)
Subjective:     Patient ID: Jodi Hanson, female    DOB: [birth year], 57 y.o.   MRN: [id number]  No chief complaint on file.   HPI  Discussed the use of AI scribe software for clinical note transcription with the patient, who gave verbal consent to proceed.  History of Present Illness   Jodi Hanson, a patient with a history of cardiac disease, presents for a routine physical. She reports feeling well overall and has been adhering to a healthy diet and regular exercise regimen. She does strength training and cardio four to five days a week and has been focusing on consuming more complex sugars and fruits. She has no new symptoms or concerns. She recently had a Hepatitis A vaccine and is up to date on all her imaging and screenings, including colonoscopy, Pap smear, and mammogram. She also recently had a dental check-up and an eye exam. She reports no changes in her family medical history, except for her mother's recent passing due to pancreatic cancer.     Patient presents today for complete physical.  Immunizations: up to date Diet: overall healthy Exercise:  regular exercise- strength and cardio 4-5 days a week Wt Readings from Last 3 Encounters:  09/17/23 145 lb (65.8 kg)  07/27/23 145 lb (65.8 kg)  08/28/22 137 lb (62.1 kg)  Colonoscopy:  06/28/2020 Pap Smear: 01/29/2021 Mammogram: 07/29/23 Vision: up to date Dental: up to date  CHF- LVEF 40-45% 2/22, mod dilation of ascending aorta, normal ETT 12/25/21, MRI 2/22 noted j    Health Maintenance Due  Topic Date Due   Hepatitis C Screening  Never done   COVID-19 Vaccine (7 - 2024-25 season) 08/01/2023    Past Medical History:  Diagnosis Date   History of exercise stress test    Exercise tolerance test 11/2021: No ST deviation, excellent exercise capacity (Ex 12', METs 13.4)   Hypertension    Mitral regurgitation    Echocardiogram 2/22: EF 40-45, freq PVCs, mod conc LVH, mod to severe MR (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), Gr 2 DD, normal RVSF, mod BAE, trivial pericardial effusion, trivial AI, mod dilation of ascending aorta (43 mm)   PVC's (premature ventricular contractions)    ZIO monitor 3/22: Sinus rhythm, PVC burden 9%, one 5 beat run NSVT    Past Surgical History:  Procedure Laterality Date   CESAREAN SECTION     HERNIA REPAIR  2005   WISDOM TOOTH EXTRACTION      Family History  Problem Relation Age of Onset   Diabetes Mother    Hyperlipidemia Mother    Hypertension Mother    Stroke Mother    Pancreatic cancer Mother        hospice are   Heart disease Father    Hypertension Brother    Hypertension Brother    HIV Brother    Heart failure Paternal Grandmother        died at 24   Lung cancer Paternal Grandfather    Hypertension Other    Colon cancer Neg Hx    Colon polyps Neg Hx    Esophageal cancer Neg Hx    Rectal cancer Neg Hx    Stomach cancer Neg Hx     Social History   Socioeconomic History   Marital status: Divorced    Spouse name: Not on file   Number of children: Not on file   Years of education: Not on file   Highest education level:  Master's degree (e.g., MA, MS, MEng, MEd, MSW, MBA)  Occupational History   Not on file  Tobacco Use   Smoking status: Never   Smokeless tobacco: Never  Substance and Sexual Activity   Alcohol use: Yes    Alcohol/week: 3.0 standard drinks of alcohol    Types: 3 Glasses of wine per week   Drug use: Never   Sexual activity: Not on file  Other Topics Concern   Not on file  Social History Narrative   Works as an Psychologist, educational-  on IT projects   One son born 1990- lives [place]   2 grandchildren   Enjoys friends, food, running, walking her dog      Social Drivers of Corporate investment banker Strain: Low Risk  (07/19/2023)   Overall Financial Resource Strain (CARDIA)    Difficulty of Paying Living Expenses: Not hard at all  Food Insecurity: No Food Insecurity (07/19/2023)   Hunger Vital Sign     Worried About Running Out of Food in the Last Year: Never true    Ran Out of Food in the Last Year: Never true  Transportation Needs: No Transportation Needs (07/19/2023)   PRAPARE - Administrator, Civil Service (Medical): No    Lack of Transportation (Non-Medical): No  Physical Activity: Sufficiently Active (07/19/2023)   Exercise Vital Sign    Days of Exercise per Week: 4 days    Minutes of Exercise per Session: 60 min  Stress: No Stress Concern Present (07/19/2023)   [employer] of Occupational Health - Occupational Stress Questionnaire    Feeling of Stress : Only a little  Social Connections: Moderately Integrated (07/19/2023)   Social Connection and Isolation Panel [NHANES]    Frequency of Communication with Friends and Family: More than three times a week    Frequency of Social Gatherings with Friends and Family: More than three times a week    Attends Religious Services: More than 4 times per year    Active Member of [employer] or Organizations: Yes    Attends Engineer, structural: More than 4 times per year    Marital Status: Divorced  Catering manager Violence: Not on file    Outpatient Medications Prior to Visit  Medication Sig Dispense Refill   Ascorbic Acid (VITAMIN C WITH ROSE HIPS) 500 MG tablet Take 500 mg by mouth daily.     betamethasone dipropionate 0.05 % cream Apply topically 2 (two) times daily. 30 g 0   Calcium Carbonate-Vit D-Min (CALCIUM 1200 PO) Take 1 tablet by mouth daily.     carvedilol (COREG) 3.125 MG tablet TAKE 1 TABLET BY MOUTH TWICE A DAY 60 tablet 0   dapagliflozin propanediol (FARXIGA) 10 MG TABS tablet Take 1 tablet (10 mg total) by mouth daily before breakfast. 90 tablet 3   Multiple Vitamins-Minerals (MULTIVITAMIN WITH MINERALS) tablet Take 1 tablet by mouth daily.     sacubitril-valsartan (ENTRESTO) 24-26 MG TAKE 1 TABLET BY MOUTH TWICE A DAY 180 tablet 3   vitamin E 45 MG (100 UNITS) capsule Take 100 Units by mouth daily.      No facility-administered medications prior to visit.    No Known Allergies  Review of Systems  Constitutional:  Negative for weight loss.  HENT:  Negative for hearing loss.   Eyes:  Negative for blurred vision.  Respiratory:  Negative for cough.   Cardiovascular:  Negative for leg swelling.  Gastrointestinal:  Negative for constipation and diarrhea.  Genitourinary:  Negative  for dysuria and frequency.  Musculoskeletal:  Negative for joint pain and myalgias.  Skin:  Negative for rash.  Neurological:  Negative for headaches.  Psychiatric/Behavioral:         Denies depression/anxiety       Objective:    Physical Exam Constitutional:      General: She is not in acute distress.    Appearance: Normal appearance. She is well-developed.  HENT:     Head: Normocephalic and atraumatic.     Right Ear: External ear normal.     Left Ear: External ear normal.  Eyes:     General: No scleral icterus. Neck:     Thyroid: No thyromegaly.  Cardiovascular:     Rate and Rhythm: Normal rate and regular rhythm.     Heart sounds: Normal heart sounds. No murmur heard. Pulmonary:     Effort: Pulmonary effort is normal. No respiratory distress.     Breath sounds: Normal breath sounds. No wheezing.  Musculoskeletal:     Cervical back: Neck supple.  Skin:    General: Skin is warm and dry.  Neurological:     Mental Status: She is alert and oriented to person, place, and time.  Psychiatric:        Mood and Affect: Mood normal.        Behavior: Behavior normal.        Thought Content: Thought content normal.        Judgment: Judgment normal.      BP 114/73 (BP Location: Right Arm, Patient Position: Sitting, Cuff Size: Small)   Pulse (!) 52   Temp (!) 97.5 F (36.4 C) (Oral)   Resp 16   Ht 5\' 4"  (1.626 m)   Wt 145 lb (65.8 kg)   SpO2 100%   BMI 24.89 kg/m  Wt Readings from Last 3 Encounters:  09/17/23 145 lb (65.8 kg)  07/27/23 145 lb (65.8 kg)  08/28/22 137 lb (62.1 kg)        Assessment & Plan:   Problem List Items Addressed This Visit       Unprioritized   Preventative health care - Primary    Up-to-date on colonoscopy, Pap smear, mammogram, and flu shot and COVID-19 booster. Regular dental and vision check-ups up to date. -Continue current health maintenance regimen.  Diet and Exercise Patient reports a 70% healthy diet and regular strength training and cardio exercise 4-5 days a week. -Encourage continuation of healthy diet and regular exercise.  Follow-up No new symptoms or concerns reported. Physical exam unremarkable. -Continue regular follow-up with cardiology. -Check lab results and follow-up as needed.      Other Visit Diagnoses       Encounter for hepatitis C screening test for low risk patient       Relevant Orders   Hepatitis C Antibody     Hyperlipidemia, unspecified hyperlipidemia type       Relevant Orders   Lipid panel   Comp Met (CMET)       I am having Jodi Hanson maintain her multivitamin with minerals, Calcium Carbonate-Vit D-Min (CALCIUM 1200 PO), vitamin C with rose hips, vitamin E, Entresto, betamethasone dipropionate, dapagliflozin propanediol, and carvedilol.  No orders of the defined types were placed in this encounter.

## 2023-09-18 LAB — HEPATITIS C ANTIBODY: Hepatitis C Ab: NONREACTIVE

## 2023-09-21 ENCOUNTER — Encounter: Payer: Self-pay | Admitting: Family

## 2023-10-04 ENCOUNTER — Other Ambulatory Visit: Payer: Self-pay | Admitting: Cardiovascular Disease

## 2023-10-28 ENCOUNTER — Ambulatory Visit: Payer: 59 | Admitting: Cardiovascular Disease

## 2024-01-25 ENCOUNTER — Telehealth: Payer: Self-pay | Admitting: *Deleted

## 2024-01-25 NOTE — Telephone Encounter (Signed)
 A message was left UX:[id number] her test.

## 2024-02-03 ENCOUNTER — Other Ambulatory Visit: Payer: Self-pay | Admitting: Cardiovascular Disease

## 2024-02-03 ENCOUNTER — Encounter: Payer: Self-pay | Admitting: Cardiovascular Disease

## 2024-02-07 ENCOUNTER — Ambulatory Visit (HOSPITAL_COMMUNITY): Payer: 59

## 2024-03-28 ENCOUNTER — Other Ambulatory Visit: Payer: Self-pay | Admitting: Family

## 2024-05-01 ENCOUNTER — Encounter: Payer: Self-pay | Admitting: Family

## 2024-05-01 NOTE — Telephone Encounter (Signed)
 Can you please print and place form in my folder?

## 2024-07-07 ENCOUNTER — Other Ambulatory Visit: Payer: Self-pay | Admitting: Cardiovascular Disease

## 2024-08-01 ENCOUNTER — Other Ambulatory Visit: Payer: Self-pay | Admitting: Nurse Practitioner

## 2024-08-06 ENCOUNTER — Encounter: Payer: Self-pay | Admitting: Cardiovascular Disease

## 2024-08-15 ENCOUNTER — Other Ambulatory Visit: Payer: Self-pay | Admitting: Emergency Medicine

## 2024-08-15 ENCOUNTER — Encounter: Payer: Self-pay | Admitting: Emergency Medicine

## 2024-08-15 ENCOUNTER — Ambulatory Visit: Attending: Emergency Medicine | Admitting: Emergency Medicine

## 2024-08-15 VITALS — BP 126/80 | HR 51 | Ht 64.0 in | Wt 147.0 lb

## 2024-08-15 DIAGNOSIS — I77810 Thoracic aortic ectasia: Secondary | ICD-10-CM

## 2024-08-15 DIAGNOSIS — I10 Essential (primary) hypertension: Secondary | ICD-10-CM | POA: Diagnosis not present

## 2024-08-15 DIAGNOSIS — I5022 Chronic systolic (congestive) heart failure: Secondary | ICD-10-CM | POA: Diagnosis not present

## 2024-08-15 DIAGNOSIS — E782 Mixed hyperlipidemia: Secondary | ICD-10-CM

## 2024-08-15 DIAGNOSIS — I340 Nonrheumatic mitral (valve) insufficiency: Secondary | ICD-10-CM | POA: Diagnosis not present

## 2024-08-15 DIAGNOSIS — I493 Ventricular premature depolarization: Secondary | ICD-10-CM

## 2024-08-15 MED ORDER — DAPAGLIFLOZIN PROPANEDIOL 10 MG PO TABS: 10.0000 mg | ORAL_TABLET | Freq: Every day | ORAL | 3 refills | Status: AC

## 2024-08-15 MED ORDER — SACUBITRIL-VALSARTAN 24-26 MG PO TABS: 1.0000 | ORAL_TABLET | Freq: Two times a day (BID) | ORAL | 3 refills | Status: AC

## 2024-08-15 NOTE — Patient Instructions (Signed)
 Medication Instructions:  NO CHANGES  Lab Work: NONE TO BE DONE TODAY.  Testing/Procedures: Your physician has requested that you have an echocardiogram. Echocardiography is a painless test that uses sound waves to create images of your heart. It provides your doctor with information about the size and shape of your heart and how well your heart's chambers and valves are working. This procedure takes approximately one hour. There are no restrictions for this procedure. Please do NOT wear cologne, perfume, aftershave, or lotions (deodorant is allowed). Please arrive 15 minutes prior to your appointment time.  Please note: We ask at that you not bring children with you during ultrasound (echo/ vascular) testing. Due to room size and safety concerns, children are not allowed in the ultrasound rooms during exams. Our front office staff cannot provide observation of children in our lobby area while testing is being conducted. An adult accompanying a patient to their appointment will only be allowed in the ultrasound room at the discretion of the ultrasound technician under special circumstances. We apologize for any inconvenience.   Follow-Up: At [facility] [place], you and your health needs are our priority.  As part of our continuing mission to provide you with exceptional heart care, our providers are all part of one team.  This team includes your primary Cardiologist (physician) and Advanced Practice Providers or APPs (Physician Assistants and Nurse Practitioners) who all work together to provide you with the care you need, when you need it.  Your next appointment:   1 YEAR  Provider:   Ozell Fell, MD  OR Lum Louis, NP

## 2024-08-15 NOTE — Progress Notes (Signed)
 Cardiology Office Note:    Date:  08/15/2024  ID:  ASAL TEAS, DOB 04-Jan-1966, MRN [id number] PCP: Daryl Setter, NP  [facility] Providers Cardiologist:  Ozell Fell, MD Cardiology APP:  Rana Lum CROME, NP       Patient Profile:       Chief Complaint: 1 year follow-up History of Present Illness:  Jodi Hanson is a 58 y.o. female with visit-pertinent history of heart failure with mildly reduced ejection fraction, mitral valve regurgitation, PVCs, dilated ascending aorta, hypertension, and hyperlipidemia  Echocardiogram in February 2022 showed LVEF 40 to 45%, moderate to severe mitral valve regurgitation, frequent PVCs.  Cardiac monitor March 2022 revealed 9% PVC burden.  Cardiac MRI in May 2022 revealed mild LVH, LVEF 50%, normal RV, moderate mitral valve regurgitation, delayed enhancement pattern suggesting prior myocardial infarction in the basal to mid inferior and inferolateral walls.  ETT March 2023 was negative for ischemia.  Repeat cardiac MRI in May 2024 revealed small to moderate circumferential pericardial effusion, mildly dilated left ventricle, EF 44%, mildly decreased RV systolic function EF 45%, LGE pattern suggestive of prior myocardial infarction, mitral valve regurgitation, dilation of ascending aorta measuring 4.3 cm.  She was last seen in clinic on 07/27/2023 by Damien, NP.  She was doing well without acute cardiovascular concerns or complaints.  Repeat cardiac MRI was ordered but not completed.   Discussed the use of AI scribe software for clinical note transcription with the patient, who gave verbal consent to proceed.  History of Present Illness Jodi Hanson is a 58 year old female who presents for cardiology follow-up.  Today she is doing well without acute cardiovascular concerns.  She is without any exertional symptoms and exercises regularly.  She is working on improving her cholesterol by exercise and increasing fiber and protein  intake but struggles with sweets. Her LDL cholesterol has been rising, with the most recent level at 159 mg/dL. She has not started medication for cholesterol management. Family history of high cholesterol is noted, and she exercises regularly, contributing to high HDL levels.  She is currently on Entresto , carvedilol , and Farxiga  for heart failure management. She experiences no chest pain, shortness of breath, orthopnea, PND, leg swelling, or palpitations and feels generally well without significant heart failure symptoms.   Review of systems:  Please see the history of present illness. All other systems are reviewed and otherwise negative.      Studies Reviewed:    EKG Interpretation Date/Time:  Tuesday August 15 2024 08:12:00 EST Ventricular Rate:  51 PR Interval:  174 QRS Duration:  112 QT Interval:  472 QTC Calculation: 435 R Axis:   69  Text Interpretation: Sinus bradycardia Minimal voltage criteria for LVH, may be normal variant ( Sokolow-Lyon ) Cannot rule out Anterior infarct , age undetermined When compared with ECG of 27-Jul-2023 08:10, Questionable change in QRS axis Confirmed by Rana Lum [phone]) on 08/15/2024 1:17:10 PM    Cardiac MRI 02/10/2023 1.  Small-moderate circumferential pericardial effusion.   2. Mildly dilated LV with wall motion abnormalities as noted above. LV EF 44%.   3.  Normal RV size with mildly decreased systolic function, EF 45%.   4. LGE pattern suggestive of prior myocardial infarction involving the basal to mid inferoseptal, inferior, and inferolateral wall segments. With > 50% wall thickness subendocardial LGE, these segments are unlikely to improve in function with revascularization.   5.  Mitral regurgitation appears mild by regurgitant fraction.   6. Dilated ascending  aorta to 4.3 cm. Would repeat ascending aorta imaging in 1 year.  Echocardiogram 03/11/2022 1. Left ventricular ejection fraction, by estimation, is 40 to 45%.  Left  ventricular ejection fraction by PLAX is 43 %. The left ventricle has  mildly decreased function. The left ventricle demonstrates regional wall  motion abnormalities (see scoring  diagram/findings for description). There is mild left ventricular  hypertrophy. Left ventricular diastolic parameters were normal. There is  severe akinesis of the left ventricular, basal-mid inferior wall,  inferoseptal wall and inferolateral wall.   2. Right ventricular systolic function is normal. The right ventricular  size is normal. There is normal pulmonary artery systolic pressure. The  estimated right ventricular systolic pressure is 23.2 mmHg.   3. Left atrial size was severely dilated.   4. Right atrial size was mildly dilated.   5. The pericardial effusion is circumferential. There is no evidence of  cardiac tamponade.   6. The mitral valve is abnormal. Mild to moderate mitral valve  regurgitation.   7. The aortic valve is tricuspid. Aortic valve regurgitation is not  visualized.   8. Aortic dilatation noted. There is borderline dilatation of the aortic  root, measuring 39 mm. There is mild dilatation of the ascending aorta,  measuring 41 mm.   9. The inferior vena cava is normal in size with greater than 50%  respiratory variability, suggesting right atrial pressure of 3 mmHg.   ETT 12/25/2021   No ST deviation was noted.   Exercise capacity was excellent. Patient exercised for 12 min and 0 sec. Maximum HR of 153 bpm. MPHR 91.0 %. Peak METS 13.4 .   Hypertensive blood pressure and normal heart rate response noted during stress. Heart rate recovery was normal.  Cardiac MRI 02/21/2021 1. Normal LV size with mild LV hypertrophy. EF 50% with basal to mid inferior and inferolateral akinesis.   2.  Normal RV size and systolic function, EF 50%.   3. Moderate mitral regurgitation with regurgitant fraction calculated 22%. Restricted posterior leaflet, suspect infarct-related MR.   4. Delayed  enhancement pattern suggests prior myocardial infarction in the basal to mid inferior and inferolateral walls.  Zio 12/10/2020 Patch Wear Time:  3 days and 10 hours (2022-03-01T23:09:17-0500 to 2022-03-05T09:09:39-0500)   Patient had a min HR of 44 bpm, max HR of 141 bpm, and avg HR of 74 bpm. Predominant underlying rhythm was Sinus Rhythm. 1 run of Ventricular Tachycardia occurred lasting 5 beats with a max rate of 141 bpm (avg 134 bpm). Isolated SVEs were rare (<1.0%),  SVE Couplets were rare (<1.0%), and no SVE Triplets were present. Isolated VEs were frequent (9.0%, 31950), VE Couplets were rare (<1.0%, 326), and VE Triplets were rare (<1.0%, 1). Ventricular Bigeminy and Trigeminy were present.    Summary: The basic rhythm is normal sinus with an average heart rate of 74 bpm There are frequent PVCs with an overall PVC burden of 9% There is one 5 beat ventricular run  There is no atrial arrhythmia, high-grade AV block, or sustained ventricular arrhythmia. Risk Assessment/Calculations:             Physical Exam:   VS:  BP 126/80 (BP Location: Left Arm, Patient Position: Sitting, Cuff Size: Normal)   Pulse (!) 51   Ht 5' 4 (1.626 m)   Wt 147 lb (66.7 kg)   BMI 25.23 kg/m    Wt Readings from Last 3 Encounters:  08/15/24 147 lb (66.7 kg)  09/17/23 145 lb (65.8 kg)  07/27/23  145 lb (65.8 kg)    GEN: Well nourished, well developed in no acute distress NECK: No JVD; No carotid bruits CARDIAC: RRR, no murmurs, rubs, gallops RESPIRATORY:  Clear to auscultation without rales, wheezing or rhonchi  ABDOMEN: Soft, non-tender, non-distended EXTREMITIES:  No edema; No acute deformity      Assessment and Plan:  HFmrEF Echo 10/2020 showed LVEF 40 to 45% Cardiac MRI 01/2023 revealed small to moderate circumferential pericardial effusion, mildly dilated LV, LVEF 44%, RVEF 45%, LGE pattern suggestive of prior myocardial infarction - Today she appears euvolemic and well compensated on exam -  NYHA class I.  She remains asymptomatic and active without any limitations - Continue carvedilol  3.125 mg twice daily, Farxiga  10 mg daily, and Entresto  24-26 mg twice daily - Will repeat echocardiogram for monitoring of her LVEF.  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  MRI is cost prohibitive for the patient - Will plan to reassess with CTA aorta  PVCs ZIO 11/2020 with frequent PVC burden of 9% - Quiescent.  She is asymptomatic and EKG today without PVCs - Continue carvedilol  3.125 mg twice daily  Hypertension Blood pressure today is well-controlled at 126/80 - Continue HF GDMT  Hyperlipidemia LDL 159 on 08/2023 and uncontrolled Cardiac MRI on 01/2023 showed prior myocardial infarction  - We discussed statin lowering therapy today however she prefers to get labs drawn with PCP on 09/2024 and further discuss with her primary care at that visit - Encouraged heart healthy dieting and daily physical exercise       Dispo:  Return in about 1 year (around 08/15/2025).  Signed, Lum LITTIE Louis, NP

## 2024-08-22 ENCOUNTER — Ambulatory Visit (HOSPITAL_COMMUNITY)
Admission: RE | Admit: 2024-08-22 | Discharge: 2024-08-22 | Disposition: A | Discharge status: Home / Self Care | Source: Ambulatory Visit | Attending: Emergency Medicine | Admitting: Emergency Medicine

## 2024-08-22 DIAGNOSIS — I77810 Thoracic aortic ectasia: Secondary | ICD-10-CM | POA: Insufficient documentation

## 2024-08-22 MED ORDER — IOHEXOL 350 MG/ML SOLN
75.0000 mL | Freq: Once | INTRAVENOUS | Status: AC | PRN
  Administered 2024-08-22: 75 mL via INTRAVENOUS

## 2024-08-28 ENCOUNTER — Other Ambulatory Visit (HOSPITAL_BASED_OUTPATIENT_CLINIC_OR_DEPARTMENT_OTHER): Payer: Self-pay | Admitting: Family

## 2024-08-28 DIAGNOSIS — Z1231 Encounter for screening mammogram for malignant neoplasm of breast: Secondary | ICD-10-CM

## 2024-09-05 ENCOUNTER — Encounter (HOSPITAL_BASED_OUTPATIENT_CLINIC_OR_DEPARTMENT_OTHER): Payer: Self-pay

## 2024-09-05 ENCOUNTER — Inpatient Hospital Stay (HOSPITAL_BASED_OUTPATIENT_CLINIC_OR_DEPARTMENT_OTHER): Admission: RE | Admit: 2024-09-05 | Discharge: 2024-09-05 | Attending: Family | Admitting: Family

## 2024-09-05 ENCOUNTER — Ambulatory Visit: Admitting: Family

## 2024-09-05 DIAGNOSIS — Z1231 Encounter for screening mammogram for malignant neoplasm of breast: Secondary | ICD-10-CM
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
## 2024-09-06 DIAGNOSIS — L659 Nonscarring hair loss, unspecified: Secondary | ICD-10-CM | POA: Insufficient documentation

## 2024-09-06 NOTE — Patient Instructions (Signed)
°  VISIT SUMMARY: During your visit, we discussed your concerns about increased hair shedding. You have noticed more hair coming out during combing and washing, but no bald spots or overall thinning. We also reviewed your family history and recent stress related to an exam, which you have now passed.  YOUR PLAN: -NONSCARRING HAIR LOSS: Nonscarring hair loss means you are losing hair without any bald spots or permanent damage to the hair follicles. This can be due to factors like stress, menopause, or hereditary predisposition. Since your stress has resolved, we will continue with your current hair, skin, and nail vitamins. We will also schedule lab tests in January to check for anemia and thyroid  function, and you have a follow-up appointment with dermatology in October.  -GENERAL HEALTH MAINTENANCE: You received your flu shot earlier this year, which is important for preventing the flu. No additional general health maintenance issues were discussed during this visit.  INSTRUCTIONS: Please continue taking your hair, skin, and nail vitamins. Schedule your lab tests for anemia and thyroid  function in January. Follow up with dermatology in October as planned.

## 2024-09-06 NOTE — Assessment & Plan Note (Signed)
°  Increased hair shedding without bald spots or volume loss. Possible factors: stress, menopause, hereditary predisposition. Differential: anemia, thyroid  dysfunction. Stress resolved. Dermatology appointment in October. - Continue hair, skin, and nail vitamins. - Schedule lab tests for anemia and thyroid  function in January. - Follow up with dermatology in October.

## 2024-09-07 ENCOUNTER — Encounter: Payer: Self-pay | Admitting: Family

## 2024-09-11 ENCOUNTER — Ambulatory Visit: Payer: Self-pay | Admitting: Family

## 2024-10-05 ENCOUNTER — Ambulatory Visit (HOSPITAL_COMMUNITY)
Admission: RE | Admit: 2024-10-05 | Discharge: 2024-10-05 | Disposition: A | Discharge status: Home / Self Care | Source: Ambulatory Visit | Attending: Cardiovascular Disease | Admitting: Cardiovascular Disease

## 2024-10-05 DIAGNOSIS — I340 Nonrheumatic mitral (valve) insufficiency: Secondary | ICD-10-CM | POA: Diagnosis present

## 2024-10-05 DIAGNOSIS — I10 Essential (primary) hypertension: Secondary | ICD-10-CM | POA: Diagnosis present

## 2024-10-05 LAB — ECHOCARDIOGRAM COMPLETE
Area-P 1/2: 3.21 cm2
MV M vel: 5.25 m/s
MV Peak grad: 110.3 mmHg
S' Lateral: 4.2 cm

## 2024-10-06 ENCOUNTER — Ambulatory Visit: Payer: Self-pay | Admitting: Emergency Medicine

## 2024-10-13 ENCOUNTER — Other Ambulatory Visit: Payer: Self-pay | Admitting: Cardiovascular Disease

## 2024-10-16 NOTE — Telephone Encounter (Signed)
 In accordance with refill protocols, please review and address the following requirements before this medication refill can be authorized:  Labs

## 2024-10-18 ENCOUNTER — Other Ambulatory Visit: Payer: Self-pay | Admitting: Medical Genetics

## 2024-11-22 ENCOUNTER — Encounter: Admitting: Family
